# Patient Record
Sex: Male | Born: 1995 | Race: White | Hispanic: No | Marital: Single | Smoking: Former smoker
Health system: Southern US, Community
[De-identification: ages and names within clinical notes are randomized; demographics above are authoritative.]

## PROBLEM LIST (undated history)

## (undated) DIAGNOSIS — J45909 Unspecified asthma, uncomplicated: Secondary | ICD-10-CM

## (undated) DIAGNOSIS — F419 Anxiety disorder, unspecified: Secondary | ICD-10-CM

## (undated) DIAGNOSIS — R001 Bradycardia, unspecified: Secondary | ICD-10-CM

## (undated) DIAGNOSIS — F329 Major depressive disorder, single episode, unspecified: Secondary | ICD-10-CM

## (undated) DIAGNOSIS — Z8489 Family history of other specified conditions: Secondary | ICD-10-CM

## (undated) DIAGNOSIS — F32A Depression, unspecified: Secondary | ICD-10-CM

## (undated) DIAGNOSIS — K219 Gastro-esophageal reflux disease without esophagitis: Secondary | ICD-10-CM

## (undated) DIAGNOSIS — R55 Syncope and collapse: Secondary | ICD-10-CM

## (undated) DIAGNOSIS — K5900 Constipation, unspecified: Secondary | ICD-10-CM

## (undated) DIAGNOSIS — F319 Bipolar disorder, unspecified: Secondary | ICD-10-CM

## (undated) DIAGNOSIS — R0600 Dyspnea, unspecified: Secondary | ICD-10-CM

---
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
## 2016-10-13 ENCOUNTER — Emergency Department (HOSPITAL_COMMUNITY): Payer: Medicaid Other

## 2016-10-13 ENCOUNTER — Emergency Department (HOSPITAL_COMMUNITY)
Admission: EM | Admit: 2016-10-13 | Discharge: 2016-10-13 | Disposition: A | Discharge status: Home / Self Care | Payer: Medicaid Other | Attending: Emergency Medicine | Admitting: Emergency Medicine

## 2016-10-13 ENCOUNTER — Encounter (HOSPITAL_COMMUNITY): Payer: Self-pay | Admitting: *Deleted

## 2016-10-13 DIAGNOSIS — Y929 Unspecified place or not applicable: Secondary | ICD-10-CM | POA: Diagnosis not present

## 2016-10-13 DIAGNOSIS — S62625A Displaced fracture of medial phalanx of left ring finger, initial encounter for closed fracture: Secondary | ICD-10-CM

## 2016-10-13 DIAGNOSIS — S62624A Displaced fracture of medial phalanx of right ring finger, initial encounter for closed fracture: Secondary | ICD-10-CM | POA: Diagnosis not present

## 2016-10-13 DIAGNOSIS — S6991XA Unspecified injury of right wrist, hand and finger(s), initial encounter: Secondary | ICD-10-CM | POA: Diagnosis present

## 2016-10-13 DIAGNOSIS — Y998 Other external cause status: Secondary | ICD-10-CM | POA: Insufficient documentation

## 2016-10-13 DIAGNOSIS — Y9389 Activity, other specified: Secondary | ICD-10-CM | POA: Diagnosis not present

## 2016-10-13 DIAGNOSIS — F17210 Nicotine dependence, cigarettes, uncomplicated: Secondary | ICD-10-CM | POA: Diagnosis not present

## 2016-10-13 DIAGNOSIS — W1839XA Other fall on same level, initial encounter: Secondary | ICD-10-CM | POA: Insufficient documentation

## 2016-10-13 DIAGNOSIS — S63254A Unspecified dislocation of right ring finger, initial encounter: Secondary | ICD-10-CM | POA: Insufficient documentation

## 2016-10-13 DIAGNOSIS — S63259A Unspecified dislocation of unspecified finger, initial encounter: Secondary | ICD-10-CM

## 2016-10-13 MED ORDER — IBUPROFEN 600 MG PO TABS: 600.0000 mg | ORAL_TABLET | Freq: Four times a day (QID) | ORAL | 0 refills | Status: DC | PRN

## 2016-10-13 NOTE — ED Provider Notes (Signed)
[facility]-EMERGENCY DEPT Provider Note   CSN: [id number] Arrival date & time: 10/13/16  1559  By signing my name below, I, Cynda Acres, attest that this documentation has been prepared under the direction and in the presence of [employer].  Electronically Signed: Cynda Acres, Scribe. 10/13/16. 4:45 PM.  History   Chief Complaint Chief Complaint  Patient presents with  . Finger Injury   HPI Comments: Tommy Fernandez is a 21 y.o. male with a history of anxiety, depression, and bipolarism, who presents to the Emergency Department complaining of sudden-onset, constant right ring finger pain that began 3 weeks ago. Patient reports play fighting with his friend, when he fell, landing on his right ring finger. Patient denies having his finger evaluated prior to today. Patient reports associated swelling. No modifying factors indicated. Patient is right hand dominant. Patient denies any fever, nausea, vomiting, numbness, weakness, or tingling.   The history is provided by the patient. No language interpreter was used.    Past Medical History:  Diagnosis Date  . Anxiety   . Bipolar and related disorder (HCC)   . Depression     There are no active problems to display for this patient.   History reviewed. No pertinent surgical history.     Home Medications    Prior to Admission medications   Medication Sig Start Date End Date Taking? Authorizing Provider  HYDROcodone-acetaminophen (NORCO/VICODIN) 5-325 MG per tablet Take one tab po q 4-6 hrs prn pain Patient not taking: Reported on 03/03/2015 12/06/14   Tammy Triplett, PA-C  meloxicam (MOBIC) 7.5 MG tablet Take 1 tablet (7.5 mg total) by mouth daily. Patient not taking: Reported on 03/03/2015 12/11/14   Elson Areas, PA-C  methocarbamol (ROBAXIN) 500 MG tablet Take 1 tablet (500 mg total) by mouth 2 (two) times daily. Patient not taking: Reported on 03/03/2015 12/11/14   Elson Areas, PA-C  naproxen (NAPROSYN) 500 MG tablet Take 1  tablet (500 mg total) by mouth 2 (two) times daily with a meal. Patient not taking: Reported on 03/03/2015 12/06/14   Pauline Aus, PA-C    Family History No family history on file.  Social History Social History  Substance Use Topics  . Smoking status: Current Every Day Smoker    Packs/day: 0.50    Years: 2.00    Types: Cigarettes  . Smokeless tobacco: Never Used  . Alcohol use Yes     Allergies   Patient has no known allergies.   Review of Systems Review of Systems  Constitutional: Negative for fever.  Gastrointestinal: Negative for nausea and vomiting.  Musculoskeletal: Positive for arthralgias (right ring finger) and joint swelling (right ring finger).  Neurological: Negative for weakness and numbness.  All other systems reviewed and are negative.    Physical Exam Updated Vital Signs BP 115/66 (BP Location: Left Arm)   Pulse 67   Temp 98.2 F (36.8 C) (Oral)   Resp 18   Ht  (1.803 m)   Wt 130 lb (59 kg)   SpO2 100%   BMI 18.13 kg/m   Physical Exam  Constitutional: He is oriented to person, place, and time. He appears well-developed.  HENT:  Head: Normocephalic and atraumatic.  Mouth/Throat: Oropharynx is clear and moist.  Eyes: Conjunctivae and EOM are normal. Pupils are equal, round, and reactive to light.  Neck: Normal range of motion. Neck supple.  Cardiovascular: Normal rate and regular rhythm.   Pulmonary/Chest: Effort normal and breath sounds normal.  Abdominal: Soft. Bowel  sounds are normal.  Musculoskeletal: Normal range of motion. He exhibits tenderness.  Capillary refill of the right ring finger is less than two seconds. Good range of motion of the DIP joint. Swelling of the PIP joint. Good range of motion of the MTP joint. Full range of motion of the wrist. Radial pulse 2+. No deformity of the thenar eminence. Two scabs of the dorsum of the ring finger. No red streaking appreciated.    Neurological: He is alert and oriented to person,  place, and time.  Skin: Skin is warm and dry. Capillary refill takes less than 2 seconds. No rash noted. No erythema. No pallor.  Psychiatric: He has a normal mood and affect.  Nursing note and vitals reviewed.    ED Treatments / Results  DIAGNOSTIC STUDIES: Oxygen Saturation is 100% on RA, normal by my interpretation.    COORDINATION OF CARE: 4:44 PM Discussed treatment plan with pt at bedside and pt agreed to plan, which includes a splint and a hand specialist referral.   Labs (all labs ordered are listed, but only abnormal results are displayed) Labs Reviewed - No data to display  EKG  EKG Interpretation None       Radiology No results found.  Procedures Procedures (including critical care time) FRACTURE CARE RIGHT RING FINGER Patient sustained an injury to the right ring finger approximately 3-4 weeks ago. X-ray reveals a mild impacted avulsion volar fracture at the base of the middle phalanx on. I discussed the fracture with the patient in terms which he understands. I've demonstrated the fracture to the patient on a paper copy of the x-ray. I discussed the mobilization and splinting procedure. The patient is in agreement and gives permission.  Patient identified by arm band. The patient was fitted with a phone/aluminum splint on the palmar surface. Adequately secured. Following the procedure, the capillary refill is less than 2 seconds. There no temperature changes appreciated. Patient tolerated the procedure without problem. The patient will use 600 mg of ibuprofen and 500 mg of Tylenol every 6 hours for discomfort. He has been asked to elevate his hand above his heart is much as possible. Medications Ordered in ED Medications - No data to display   Initial Impression / Assessment and Plan / ED Course  I have reviewed the triage vital signs and the nursing notes.  Pertinent labs & imaging results that were available during my care of the patient were reviewed by me and  considered in my medical decision making (see chart for details).    Final Clinical Impressions(s) / ED Diagnoses   MDM: Patient injured his right ring finger about 3 weeks ago. He has had problems with pain and swelling since that time. X-ray reveals an avulsion fracture of 7 mm and a  dorsal displacement. The patient will be fitted with a finger splint and referred to hand surgery. I discussed the importance of seeing the hand specialist and possible long term problems of not having this problem resolved. Patient acknowledges discharge instructions.   Final diagnoses:  Closed displaced fracture of middle phalanx of left ring finger, initial encounter  Dislocation of finger, initial encounter    New Prescriptions New Prescriptions   No medications on file   **I personally performed the services described in this documentation, which was scribed in my presence. The recorded information has been reviewed and is accurate.Ivery Quale, PA-C 10/13/16 1706    Linwood Dibbles, MD 10/15/16 2104

## 2016-10-13 NOTE — ED Triage Notes (Signed)
Pt was playing with a friend when he "jammed" his finger 3-4 weeks ago. Right hand ring finger is swollen and tender to the touch

## 2016-10-13 NOTE — Discharge Instructions (Signed)
You have an avulsion fracture  and a dislocation of the ring finger on the right hand. It is important that you see a hand specialist as soon as possible. Use the splint until seen by the specialist. Use  of ibuprofen and  of tylenol every 6 hours for discomfort. Keep your hand elevated above your heart as much as possible.

## 2016-10-20 ENCOUNTER — Other Ambulatory Visit: Payer: Self-pay | Admitting: Orthopedic Surgery

## 2016-10-20 NOTE — H&P (Signed)
Tommy Fernandez is an 21 y.o. male.   Chief Complaint: CLOSED DISPLACED FRACTURE OF THE MIDDLE PHALANX OF THE RIGHT RING FINGER  HPI: Tommy Fernandez IS A 21 Y/O RIGHT HAND DOMINANT MALE WHO INJURED HIS RIGHT RING FINGER ABOUT 4 WEEKS AGO WHILE "HORSING AROUND" WITH A FRIEND.  HE WAS SEEN IN THE EMERGENCY DEPARTMENT ON 10/13/16 AND PLACED IN A FINGER SPLINT.  HE PRESENTED TO THE OFFICE ON 10/16/16 FOR FURTHER EVALUATION. DISCUSSED THE REASON AND RATIONALE FOR SURGERY. DISCUSSED THE SURGICAL PROCEDURE, INCLUDING THE RISKS VERSUS BENEFITS, AND THE POST-OPERATIVE RECOVERY PROCESS.  ADVISED PATIENT TO CONTINUE TO WEAR THE FINGER SPLINT UNTIL HE ARRIVES FOR SURGERY. THE PATIENT IS HERE TODAY FOR SURGERY.   Past Medical History:  Diagnosis Date  . Anxiety   . Bipolar and related disorder (HCC)   . Depression     No past surgical history on file.  No family history on file. Social History:  reports that he has been smoking Cigarettes.  He has a 1.00 pack-year smoking history. He has never used smokeless tobacco. He reports that he drinks alcohol. He reports that he uses drugs.  Allergies: No Known Allergies  No prescriptions prior to admission.    No results found for this or any previous visit (from the past 48 hour(s)). No results found.  ROS NO RECENT ILLNESSES OR HOSPITALIZATIONS  There were no vitals taken for this visit. Physical Exam  General Appearance:  Alert, cooperative, no distress, appears stated age  Head:  Normocephalic, without obvious abnormality, atraumatic  Eyes:  Pupils equal, conjunctiva/corneas clear,         Throat: Lips, mucosa, and tongue normal; teeth and gums normal  Neck: No visible masses     Lungs:   respirations unlabored  Chest Wall:  No tenderness or deformity  Heart:  Regular rate and rhythm,  Abdomen:   Soft, non-tender,         Extremities: RUE: SOFT TISSUE SWELLING OF THE RIGHT RING FINGER. NO ECCHYMOSIS, ERYTHEMA, OR OPEN WOUNDS OF THE FINGER. PULSES  2+ BILATERALLY. SENSATION INTACT TO LIGHT TOUCH. LIMITED FLEXION AND EXTENSION OF THE RING FINGER. FULL RANGE OF MOTION OF ALL OTHER DIGITS.  Pulses: 2+ and symmetric  Skin: Skin color, texture, turgor normal, no rashes or lesions     Neurologic: Normal    Assessment CLOSED DISPLACED FRACTURE OF THE MIDDLE PHALANX OF THE RIGHT RING FINGER/ Ring finger fracture dislocation of the proximal interphalangeal joint  Plan RIGHT RING FINGER CLOSED REDUCTION AND PINNING, POSSIBLE EXTERNAL FIXATION AND OPEN REDUCTION  Patient was seen and examined today in the hospital. The patient is very anxious. The patient has a very difficult problem with regard to his right ring finger. We will make every effort to write try to restore the articular congruity of the ring finger PIP joint. This may require external fixation versus open reduction versus pinning of the joint. Risks include but not limited to bleeding infection damage to nearby nerves arteries or tendons stiffness loss of motion of wrists and digits and need for further surgical intervention. A signed informed consent was obtained. All questions were answered today. Patient voiced understanding of plan the reason for the intervention.   R/B/A DISCUSSED WITH PT IN OFFICE.  PT VOICED UNDERSTANDING OF PLAN CONSENT SIGNED DAY OF SURGERY PT SEEN AND EXAMINED PRIOR TO OPERATIVE PROCEDURE/DAY OF SURGERY SITE MARKED. QUESTIONS ANSWERED WILL GO HOME FOLLOWING SURGERY  WE ARE PLANNING SURGERY FOR YOUR UPPER EXTREMITY. THE RISKS AND BENEFITS  OF SURGERY INCLUDE BUT NOT LIMITED TO BLEEDING INFECTION, DAMAGE TO NEARBY NERVES ARTERIES TENDONS, FAILURE OF SURGERY TO ACCOMPLISH ITS INTENDED GOALS, PERSISTENT SYMPTOMS AND NEED FOR FURTHER SURGICAL INTERVENTION. WITH THIS IN MIND WE WILL PROCEED. I HAVE DISCUSSED WITH THE PATIENT THE PRE AND POSTOPERATIVE REGIMEN AND THE DOS AND DON'TS. PT VOICED UNDERSTANDING AND INFORMED CONSENT SIGNED.  Karma Greaser 10/20/2016, 12:06 PM

## 2016-10-21 ENCOUNTER — Encounter (HOSPITAL_COMMUNITY): Payer: Self-pay | Admitting: *Deleted

## 2016-10-21 NOTE — Progress Notes (Signed)
Anesthesia Chart Review:  Pt is a same day work up.   Pt is a 21 year old male scheduled for r ring finger closed reduction and pinning, possible open reduction external fixation on 10/22/2016 with Bradly Bienenstock, MD  PMH includes:  Syncope and bradycardia, asthma (as a child), anxiety, depression, bipolar disorder, GERD. Former smoker. BMI 18  - Pt was evaluated by pediatric cardiology in 2015 for syncope and bradycardia (notes in care everywhere).  Notes indicate symptoms most likely due to excessive caffeine consumption and symptoms resolved with cessation of coffee and sodas and increasing fluid intake. No further workup recommended.   Medications reviewed.  Labs will be obtained DOS.   EKG will be obtained DOS.   Pt reported recent chest pain to PAT RN during pre-op telephone call. Pt believes it is anxiety.  EKG will be obtained DOS.  Pt will need further assessment by assigned anesthesiologist DOS.   Rica Mast, FNP-BC [facility]/Anesthesiology Phone: [phone] 10/21/2016 2:35 PM

## 2016-10-21 NOTE — Progress Notes (Addendum)
Mr Neidert  Was seen in 2015 by a pediatric cardiologist to evaluate and patient had episodes of syncope and collapse.  EKG showed Sinus Brady, Heart rate 53.  Patient was instructed to stay hydrated , decrease caffeine and to follow up with cardiologist if needed.

## 2016-10-21 NOTE — Progress Notes (Signed)
I spoke with Tommy Fernandez who reports that he has not had any syncopal episodes since 2017.  Patient does report that he has had chest pain, last time was 2 weeks ago.  Has had  Chest pain on and off for a year or so.Patient describes the pain as crushing in lower sternum 7- 8 of a 10. he gets short of breath when this happens, denies light headedness or nausea. Patient reports that is is usually walking or something and then he sits down and it goes away in 1 - 2 minutes. Patient has not seen a  Dr about this, he does not have a PCP.  "I figure it it anxiety."

## 2016-10-22 ENCOUNTER — Encounter (HOSPITAL_COMMUNITY)
Admission: RE | Disposition: A | Discharge status: Home / Self Care | Payer: Self-pay | Source: Ambulatory Visit | Attending: Orthopedic Surgery

## 2016-10-22 ENCOUNTER — Ambulatory Visit (HOSPITAL_COMMUNITY): Payer: Medicaid Other | Admitting: Emergency Medicine

## 2016-10-22 ENCOUNTER — Encounter (HOSPITAL_COMMUNITY): Payer: Self-pay

## 2016-10-22 ENCOUNTER — Ambulatory Visit (HOSPITAL_COMMUNITY)
Admission: RE | Admit: 2016-10-22 | Discharge: 2016-10-22 | Disposition: A | Discharge status: Home / Self Care | Payer: Medicaid Other | Source: Ambulatory Visit | Attending: Orthopedic Surgery | Admitting: Orthopedic Surgery

## 2016-10-22 DIAGNOSIS — S62624A Displaced fracture of medial phalanx of right ring finger, initial encounter for closed fracture: Secondary | ICD-10-CM

## 2016-10-22 DIAGNOSIS — F319 Bipolar disorder, unspecified: Secondary | ICD-10-CM | POA: Insufficient documentation

## 2016-10-22 DIAGNOSIS — Y9383 Activity, rough housing and horseplay: Secondary | ICD-10-CM | POA: Insufficient documentation

## 2016-10-22 DIAGNOSIS — F17210 Nicotine dependence, cigarettes, uncomplicated: Secondary | ICD-10-CM | POA: Insufficient documentation

## 2016-10-22 DIAGNOSIS — S63284A Dislocation of proximal interphalangeal joint of right ring finger, initial encounter: Secondary | ICD-10-CM | POA: Insufficient documentation

## 2016-10-22 HISTORY — DX: Dyspnea, unspecified: R06.00

## 2016-10-22 HISTORY — DX: Constipation, unspecified: K59.00

## 2016-10-22 HISTORY — PX: CLOSED REDUCTION FINGER WITH PERCUTANEOUS PINNING: SHX5612

## 2016-10-22 HISTORY — DX: Unspecified asthma, uncomplicated: J45.909

## 2016-10-22 HISTORY — DX: Family history of other specified conditions: Z84.89

## 2016-10-22 HISTORY — DX: Gastro-esophageal reflux disease without esophagitis: K21.9

## 2016-10-22 HISTORY — DX: Bradycardia, unspecified: R00.1

## 2016-10-22 HISTORY — DX: Syncope and collapse: R55

## 2016-10-22 LAB — COMPREHENSIVE METABOLIC PANEL
ALBUMIN: 4.9 g/dL (ref 3.5–5.0)
ALK PHOS: 53 U/L (ref 38–126)
ALT: 12 U/L — AB (ref 17–63)
AST: 21 U/L (ref 15–41)
Anion gap: 10 (ref 5–15)
BILIRUBIN TOTAL: 0.7 mg/dL (ref 0.3–1.2)
BUN: 18 mg/dL (ref 6–20)
CALCIUM: 9.6 mg/dL (ref 8.9–10.3)
CO2: 26 mmol/L (ref 22–32)
CREATININE: 1.17 mg/dL (ref 0.61–1.24)
Chloride: 103 mmol/L (ref 101–111)
GFR calc Af Amer: >60 mL/min (ref >60)
Glucose, Bld: 87 mg/dL (ref 65–99)
POTASSIUM: 3.7 mmol/L (ref 3.5–5.1)
Sodium: 139 mmol/L (ref 135–145)
Total Protein: 7.7 g/dL (ref 6.5–8.1)

## 2016-10-22 LAB — CBC
HEMATOCRIT: 46.7 % (ref 39.0–52.0)
HEMOGLOBIN: 16.5 g/dL (ref 13.0–17.0)
MCH: 30.2 pg (ref 26.0–34.0)
MCHC: 35.3 g/dL (ref 30.0–36.0)
MCV: 85.4 fL (ref 78.0–100.0)
Platelets: 187 10*3/uL (ref 150–400)
RBC: 5.47 MIL/uL (ref 4.22–5.81)
RDW: 12.1 % (ref 11.5–15.5)
WBC: 10.7 10*3/uL — ABNORMAL HIGH (ref 4.0–10.5)

## 2016-10-22 SURGERY — CLOSED REDUCTION, FINGER, WITH PERCUTANEOUS PINNING
Anesthesia: General | Laterality: Right

## 2016-10-22 MED ORDER — KETOROLAC TROMETHAMINE 30 MG/ML IJ SOLN
INTRAMUSCULAR | Status: AC
  Administered 2016-10-22: 30 mg via INTRAVENOUS
  Filled 2016-10-22: qty 1

## 2016-10-22 MED ORDER — OXYCODONE HCL 5 MG PO TABS
5.0000 mg | ORAL_TABLET | Freq: Once | ORAL | Status: AC | PRN
  Administered 2016-10-22: 5 mg via ORAL

## 2016-10-22 MED ORDER — HYDROMORPHONE HCL 1 MG/ML IJ SOLN
INTRAMUSCULAR | Status: AC
  Administered 2016-10-22: 0.5 mg via INTRAVENOUS
  Filled 2016-10-22: qty 0.5

## 2016-10-22 MED ORDER — PROPOFOL 10 MG/ML IV BOLUS
INTRAVENOUS | Status: DC | PRN
Start: 2016-10-22 — End: 2016-10-22
  Administered 2016-10-22: 200 mg via INTRAVENOUS

## 2016-10-22 MED ORDER — MIDAZOLAM HCL 2 MG/2ML IJ SOLN
INTRAMUSCULAR | Status: AC
  Filled 2016-10-22: qty 2

## 2016-10-22 MED ORDER — OXYCODONE-ACETAMINOPHEN 5-325 MG PO TABS: 1.0000 | ORAL_TABLET | Freq: Three times a day (TID) | ORAL | 0 refills | Status: AC

## 2016-10-22 MED ORDER — ONDANSETRON HCL 4 MG/2ML IJ SOLN
INTRAMUSCULAR | Status: DC | PRN
  Administered 2016-10-22: 4 mg via INTRAVENOUS

## 2016-10-22 MED ORDER — DOCUSATE SODIUM 100 MG PO CAPS: 100.0000 mg | ORAL_CAPSULE | Freq: Two times a day (BID) | ORAL | 0 refills | Status: DC

## 2016-10-22 MED ORDER — DIPHENHYDRAMINE HCL 50 MG/ML IJ SOLN
INTRAMUSCULAR | Status: AC
  Filled 2016-10-22: qty 1

## 2016-10-22 MED ORDER — FENTANYL CITRATE (PF) 100 MCG/2ML IJ SOLN
INTRAMUSCULAR | Status: DC | PRN
  Administered 2016-10-22: 50 ug via INTRAVENOUS
  Administered 2016-10-22 (×2): 25 ug via INTRAVENOUS
  Administered 2016-10-22: 50 ug via INTRAVENOUS

## 2016-10-22 MED ORDER — CHLORHEXIDINE GLUCONATE 4 % EX LIQD: 60.0000 mL | Freq: Once | CUTANEOUS | Status: DC

## 2016-10-22 MED ORDER — HYDROMORPHONE HCL 1 MG/ML IJ SOLN
0.2500 mg | INTRAMUSCULAR | Status: DC | PRN
  Administered 2016-10-22 (×3): 0.5 mg via INTRAVENOUS

## 2016-10-22 MED ORDER — FENTANYL CITRATE (PF) 250 MCG/5ML IJ SOLN
INTRAMUSCULAR | Status: AC
  Filled 2016-10-22: qty 10

## 2016-10-22 MED ORDER — OXYCODONE HCL 5 MG PO TABS
ORAL_TABLET | ORAL | Status: AC
  Administered 2016-10-22: 5 mg via ORAL
  Filled 2016-10-22: qty 1

## 2016-10-22 MED ORDER — HYDROMORPHONE HCL 1 MG/ML IJ SOLN
INTRAMUSCULAR | Status: AC
  Administered 2016-10-22: 0.5 mg via INTRAVENOUS
  Filled 2016-10-22: qty 1

## 2016-10-22 MED ORDER — CEFAZOLIN SODIUM-DEXTROSE 2-4 GM/100ML-% IV SOLN: 2.0000 g | INTRAVENOUS | Status: DC

## 2016-10-22 MED ORDER — OXYCODONE-ACETAMINOPHEN 5-325 MG PO TABS
ORAL_TABLET | ORAL | Status: AC
  Administered 2016-10-22: 1
  Filled 2016-10-22: qty 1

## 2016-10-22 MED ORDER — SODIUM CHLORIDE 0.9 % IR SOLN
Status: DC | PRN
  Administered 2016-10-22: 1000 mL

## 2016-10-22 MED ORDER — DIPHENHYDRAMINE HCL 50 MG/ML IJ SOLN
INTRAMUSCULAR | Status: DC | PRN
  Administered 2016-10-22: 12.5 mg via INTRAVENOUS

## 2016-10-22 MED ORDER — CEFAZOLIN SODIUM 1 G IJ SOLR
INTRAMUSCULAR | Status: DC | PRN
  Administered 2016-10-22: 2 g via INTRAMUSCULAR

## 2016-10-22 MED ORDER — MIDAZOLAM HCL 5 MG/5ML IJ SOLN
INTRAMUSCULAR | Status: DC | PRN
  Administered 2016-10-22 (×2): 2 mg via INTRAVENOUS

## 2016-10-22 MED ORDER — OXYCODONE HCL 5 MG/5ML PO SOLN: 5.0000 mg | Freq: Once | ORAL | Status: AC | PRN

## 2016-10-22 MED ORDER — GLYCOPYRROLATE 0.2 MG/ML IJ SOLN
INTRAMUSCULAR | Status: DC | PRN
  Administered 2016-10-22: 0.2 mg via INTRAVENOUS

## 2016-10-22 MED ORDER — KETOROLAC TROMETHAMINE 30 MG/ML IJ SOLN
30.0000 mg | Freq: Once | INTRAMUSCULAR | Status: AC
  Administered 2016-10-22: 30 mg via INTRAVENOUS

## 2016-10-22 MED ORDER — ONDANSETRON HCL 4 MG/2ML IJ SOLN
INTRAMUSCULAR | Status: AC
  Filled 2016-10-22: qty 2

## 2016-10-22 MED ORDER — CEFAZOLIN SODIUM-DEXTROSE 2-4 GM/100ML-% IV SOLN
INTRAVENOUS | Status: AC
  Filled 2016-10-22: qty 100

## 2016-10-22 MED ORDER — LACTATED RINGERS IV SOLN
INTRAVENOUS | Status: DC | PRN
  Administered 2016-10-22 (×2): via INTRAVENOUS

## 2016-10-22 MED ORDER — PROPOFOL 10 MG/ML IV BOLUS
INTRAVENOUS | Status: AC
  Filled 2016-10-22: qty 20

## 2016-10-22 MED ORDER — LIDOCAINE HCL (CARDIAC) 20 MG/ML IV SOLN
INTRAVENOUS | Status: DC | PRN
  Administered 2016-10-22: 60 mg via INTRAVENOUS

## 2016-10-22 SURGICAL SUPPLY — 37 items
BANDAGE ACE 3X5.8 VEL STRL LF (GAUZE/BANDAGES/DRESSINGS) IMPLANT
BANDAGE ACE 4X5 VEL STRL LF (GAUZE/BANDAGES/DRESSINGS) ×3 IMPLANT
BENZOIN TINCTURE PRP APPL 2/3 (GAUZE/BANDAGES/DRESSINGS) IMPLANT
BLADE CLIPPER SURG (BLADE) IMPLANT
BNDG ELASTIC 2X5.8 VLCR STR LF (GAUZE/BANDAGES/DRESSINGS) ×3 IMPLANT
BNDG GAUZE ELAST 4 BULKY (GAUZE/BANDAGES/DRESSINGS) ×3 IMPLANT
CLOSURE WOUND 1/2 X4 (GAUZE/BANDAGES/DRESSINGS)
COVER SURGICAL LIGHT HANDLE (MISCELLANEOUS) ×3 IMPLANT
CUFF TOURNIQUET SINGLE 18IN (TOURNIQUET CUFF) ×3 IMPLANT
CUFF TOURNIQUET SINGLE 24IN (TOURNIQUET CUFF) IMPLANT
DRAPE OEC MINIVIEW 54X84 (DRAPES) ×3 IMPLANT
DRSG EMULSION OIL 3X3 NADH (GAUZE/BANDAGES/DRESSINGS) IMPLANT
GAUZE SPONGE 4X4 12PLY STRL (GAUZE/BANDAGES/DRESSINGS) ×3 IMPLANT
GAUZE XEROFORM 1X8 LF (GAUZE/BANDAGES/DRESSINGS) ×3 IMPLANT
GLOVE BIOGEL PI IND STRL 8.5 (GLOVE) ×1 IMPLANT
GLOVE BIOGEL PI INDICATOR 8.5 (GLOVE) ×2
GLOVE SURG ORTHO 8.0 STRL STRW (GLOVE) ×3 IMPLANT
GOWN STRL REUS W/ TWL LRG LVL3 (GOWN DISPOSABLE) ×2 IMPLANT
GOWN STRL REUS W/TWL LRG LVL3 (GOWN DISPOSABLE) ×4
GUIDEWIRE ORTH 6X062XTROC NS (WIRE) ×1 IMPLANT
K-WIRE .062 (WIRE) ×2
KIT BASIN OR (CUSTOM PROCEDURE TRAY) ×3 IMPLANT
KIT ROOM TURNOVER OR (KITS) ×3 IMPLANT
NS IRRIG 1000ML POUR BTL (IV SOLUTION) ×3 IMPLANT
PACK ORTHO EXTREMITY (CUSTOM PROCEDURE TRAY) ×3 IMPLANT
PAD ARMBOARD 7.5X6 YLW CONV (MISCELLANEOUS) ×3 IMPLANT
SOAP 2 % CHG 4 OZ (WOUND CARE) IMPLANT
STRIP CLOSURE SKIN 1/2X4 (GAUZE/BANDAGES/DRESSINGS) IMPLANT
SUT ETHIBOND 4 0 TF (SUTURE) ×3 IMPLANT
SUT ETHILON 4 0 P 3 18 (SUTURE) IMPLANT
SUT ETHILON 5 0 P 3 18 (SUTURE)
SUT NYLON ETHILON 5-0 P-3 1X18 (SUTURE) IMPLANT
SUT PROLENE 4 0 P 3 18 (SUTURE) IMPLANT
SUT VICRYL RAPIDE 4/0 PS 2 (SUTURE) ×3 IMPLANT
TOWEL OR 17X24 6PK STRL BLUE (TOWEL DISPOSABLE) IMPLANT
TOWEL OR 17X26 10 PK STRL BLUE (TOWEL DISPOSABLE) IMPLANT
WATER STERILE IRR 1000ML POUR (IV SOLUTION) IMPLANT

## 2016-10-22 NOTE — Anesthesia Preprocedure Evaluation (Signed)
Anesthesia Evaluation  Patient identified by MRN, date of birth, ID band  Reviewed: Allergy & Precautions, NPO status , Patient's Chart, lab work & pertinent test results  History of Anesthesia Complications Negative for: history of anesthetic complications  Airway Mallampati: I  TM Distance: >3 FB Neck ROM: Full    Dental  (+) Teeth Intact   Pulmonary asthma , former smoker,    breath sounds clear to auscultation       Cardiovascular negative cardio ROS   Rhythm:Regular     Neuro/Psych PSYCHIATRIC DISORDERS Anxiety Depression Bipolar Disorder    GI/Hepatic negative GI ROS, Neg liver ROS,   Endo/Other  negative endocrine ROS  Renal/GU negative Renal ROS     Musculoskeletal   Abdominal   Peds  Hematology negative hematology ROS (+)   Anesthesia Other Findings   Reproductive/Obstetrics                             Anesthesia Physical Anesthesia Plan  ASA: II  Anesthesia Plan: General   Post-op Pain Management:    Induction: Intravenous  Airway Management Planned: LMA  Additional Equipment: None  Intra-op Plan:   Post-operative Plan: Extubation in OR  Informed Consent: I have reviewed the patients History and Physical, chart, labs and discussed the procedure including the risks, benefits and alternatives for the proposed anesthesia with the patient or authorized representative who has indicated his/her understanding and acceptance.   Dental advisory given  Plan Discussed with: CRNA and Surgeon  Anesthesia Plan Comments:         Anesthesia Quick Evaluation

## 2016-10-22 NOTE — Transfer of Care (Signed)
Immediate Anesthesia Transfer of Care Note  Patient: Tommy Fernandez  Procedure(s) Performed: Procedure(s): RIGHT RING FINGER CLOSED REDUCTION AND PINNING, POSSIBLE OPEN REDUCTION EXTERNAL FIXATION (ORIF) FINGER (Right)  Patient Location: PACU  Anesthesia Type:General  Level of Consciousness: awake and alert   Airway & Oxygen Therapy: Patient Spontanous Breathing and Patient connected to nasal cannula oxygen  Post-op Assessment: Report given to RN, Post -op Vital signs reviewed and stable and Patient moving all extremities  Post vital signs: Reviewed and stable  Last Vitals:  Vitals:   10/22/16 1419  BP: 131/86  Pulse: 72  Resp: 18  Temp: 37.1 C    Last Pain:  Vitals:   10/22/16 1419  TempSrc: Oral      Patients Stated Pain Goal: 3 (10/22/16 1414)  Complications: No apparent anesthesia complications

## 2016-10-22 NOTE — Op Note (Signed)
PREOPERATIVE DIAGNOSIS: Right ring finger middle phalanx fracture Right ring finger proximal interphalangeal joint dislocation  POSTOPERATIVE DIAGNOSIS: Same  ATTENDING SURGEON: Dr Bradly Bienenstock he was scrubbed and present for the entire procedure  ASSISTANT SURGEON: Lambert Mody PA-C who was scrubbed and necessary for the entire procedure for the open reduction internal fixation closure and application of the splint  ANESTHESIA: General via laryngeal mask airway  OPERATIVE PROCEDURE: #1. Open treatment of right ring finger middle phalanx fracture involving the articular surface of the interphalangeal joint requiring internal fixation  #2 open treatment of right ring finger proximal interphalangeal dislocation  #3. Application of uniplane external fixation  IMPLANTS: 0.045 K wire  RADIOGRAPHIC INTERPRETATION: AP lateral and oblique views of the finger do show the reduction of the PIP joint with a cross K wire in good position.  SURGICAL INDICATIONS: Is a 21 year old right-hand-dominant gentleman who presented to the office with the 4 week history of an injury to the right ring finger. Patient was seen and evaluated and recommended to undergo the above procedure. Risks of surgery include but not limited to bleeding infection damage to nearby nerves arteries or tendons loss of motion of wrists and digits incomplete relief of symptoms and need for further surgical intervention.  SURGICAL TECHNIQUE: Patient is properly identified in the preoperative holding area marked for a permanent marker was made on the right ring finger to indicate the correct operative site. Patient brought back to operating room placed supine on anesthesia and table general anesthesia was administered. A well-padded tourniquet was then placed on the right brachium and sealed with a 1000 drape. The right upper extremity and prepped and draped in normal sterile fashion. A timeout was called cracks that was  identified and the procedure was then begun. Attention was then turned to the right ring finger a longitudinal incision made directly over the dorsal aspect of the ring finger directly over the PIP joint. Dissection carried down through the skin and subcutaneous tissue. The extensor interval was split longitudinally exposing the joint. The patient did have a comminuted middle phalanx fracture. Open using a Therapist, nutritional the joint was then reduced and reduction of the joint was then carried out. Did not feel that the volar fragments were amenable to internal fixation. I also do not feel the patient was a candidate for hemi-hamate arthroplasty. Once this is carried out joint was then reduced open treatment of the intra-articular middle phalanx fracture was then done as well as the open treatment of the interphalangeal joint dislocation reduced and a 0.045 K wire was placed. Application the uniplane neck sterile fixation was then carried out. The wound was then thoroughly irrigated. The extensor interval was then closed with 4-0 Ethibond suture. Skin was then closed with 4-0 Vicryl simple sutures. Xeroform dressings circumflex compressive bandage applied. The patient was placed in this ulnar gutter splint extubated taken recovery room in good condition.  POSTOPERATIVE PLAN: To be discharged to home. Seen back now office in approximately 10 days for wound check. X-rays application another ulnar gutter splint. K wire in for a total of 4 weeks. Haywire at the 4 week mark and then begin some gentle motion. Dorsal blocking splint when the K wire comes are keeping him in slight flexion.  Prognosis: poor, the patient does have a highly comminuted finger fracture dislocation. Given the high degree of comminution and the volar loss of the articular surface I think the patient is going to be more of a candidate for arthrodesis of the  proximal interphalangeal joint should he have persistent pain or instability.

## 2016-10-22 NOTE — Discharge Instructions (Signed)
KEEP BANDAGE CLEAN AND DRY CALL OFFICE FOR F/U APPT [phone] in 10 days KEEP HAND ELEVATED ABOVE HEART OK TO APPLY ICE TO OPERATIVE AREA CONTACT OFFICE IF ANY WORSENING PAIN OR CONCERNS. General Anesthesia, Adult, Care After These instructions provide you with information about caring for yourself after your procedure. Your health care provider may also give you more specific instructions. Your treatment has been planned according to current medical practices, but problems sometimes occur. Call your health care provider if you have any problems or questions after your procedure. What can I expect after the procedure? After the procedure, it is common to have:  Vomiting.  A sore throat.  Mental slowness. It is common to feel:  Nauseous.  Cold or shivery.  Sleepy.  Tired.  Sore or achy, even in parts of your body where you did not have surgery. Follow these instructions at home: For at least 24 hours after the procedure:   Do not:  Participate in activities where you could fall or become injured.  Drive.  Use heavy machinery.  Drink alcohol.  Take sleeping pills or medicines that cause drowsiness.  Make important decisions or sign legal documents.  Take care of children on your own.  Rest. Eating and drinking   If you vomit, drink water, juice, or soup when you can drink without vomiting.  Drink enough fluid to keep your urine clear or pale yellow.  Make sure you have little or no nausea before eating solid foods.  Follow the diet recommended by your health care provider. General instructions   Have a responsible adult stay with you until you are awake and alert.  Return to your normal activities as told by your health care provider. Ask your health care provider what activities are safe for you.  Take over-the-counter and prescription medicines only as told by your health care provider.  If you smoke, do not smoke without supervision.  Keep all  follow-up visits as told by your health care provider. This is important. Contact a health care provider if:  You continue to have nausea or vomiting at home, and medicines are not helpful.  You cannot drink fluids or start eating again.  You cannot urinate after 8-12 hours.  You develop a skin rash.  You have fever.  You have increasing redness at the site of your procedure. Get help right away if:  You have difficulty breathing.  You have chest pain.  You have unexpected bleeding.  You feel that you are having a life-threatening or urgent problem. This information is not intended to replace advice given to you by your health care provider. Make sure you discuss any questions you have with your health care provider. Document Released: 09/29/2000 Document Revised: 11/26/2015 Document Reviewed: 06/07/2015 Elsevier Interactive Patient Education  2017 [employer].

## 2016-10-22 NOTE — OR Nursing (Signed)
Pt c/o 10/10 pain after dilaudid, oxycodone IR  and one percocet.Marland Kitchen  HR 57, RR 8-12 with prompts for deep breathing/coughing, BP 135/94, sats 95% on RA.  Pupils pinpoint. Dr. Okey Dupre notified of continued pain despite narcotics.  Toradol  IV ordered and given.

## 2016-10-23 ENCOUNTER — Encounter (HOSPITAL_COMMUNITY): Payer: Self-pay | Admitting: Orthopedic Surgery

## 2016-10-24 NOTE — Anesthesia Postprocedure Evaluation (Signed)
Anesthesia Post Note  Patient: Tommy Fernandez  Procedure(s) Performed: Procedure(s) (LRB): RIGHT RING FINGER CLOSED REDUCTION AND PINNING, POSSIBLE OPEN REDUCTION EXTERNAL FIXATION (ORIF) FINGER (Right)  Patient location during evaluation: PACU Anesthesia Type: General Level of consciousness: sedated and patient cooperative Pain management: pain level controlled Vital Signs Assessment: post-procedure vital signs reviewed and stable Respiratory status: spontaneous breathing Cardiovascular status: stable Anesthetic complications: no       Last Vitals:  Vitals:   10/22/16 1830 10/22/16 1845  BP: (!) 141/112 (!) 136/106  Pulse: 63 (!) 55  Resp: 16 17  Temp:  36.7 C    Last Pain:  Vitals:   10/22/16 1830  TempSrc:   PainSc: 9                  Lewie Loron

## 2016-12-10 ENCOUNTER — Ambulatory Visit: Payer: Medicaid Other | Attending: Orthopedic Surgery | Admitting: *Deleted

## 2016-12-10 ENCOUNTER — Encounter: Payer: Self-pay | Admitting: *Deleted

## 2016-12-10 DIAGNOSIS — R278 Other lack of coordination: Secondary | ICD-10-CM | POA: Diagnosis present

## 2016-12-10 DIAGNOSIS — M2560 Stiffness of unspecified joint, not elsewhere classified: Secondary | ICD-10-CM | POA: Diagnosis present

## 2016-12-10 DIAGNOSIS — M6281 Muscle weakness (generalized): Secondary | ICD-10-CM | POA: Diagnosis present

## 2016-12-10 DIAGNOSIS — R600 Localized edema: Secondary | ICD-10-CM | POA: Diagnosis present

## 2016-12-10 DIAGNOSIS — M79644 Pain in right finger(s): Secondary | ICD-10-CM

## 2016-12-10 NOTE — Patient Instructions (Signed)
We will be contacting the doctors office to see if they'd like you to begin any active movement or exercises to that right ring finger. Wear the splint until we follow up with you.   WEARING SCHEDULE:  Wear splint at ALL times except for hygiene care.  PURPOSE:  To prevent movement and for protection until injury can heal  CARE OF SPLINT:  Keep splint away from heat sources including: stove, radiator or furnace, or a car in sunlight. The splint can melt and will no longer fit you properly  Keep away from pets and children  Clean the splint with rubbing alcohol or luke warm water and soap. Do not use hot water. * During this time, make sure you also clean your hand/arm as instructed by your therapist and/or perform dressing changes as needed. Then dry hand/arm completely before replacing splint. (When cleaning hand/arm, keep it immobilized in same position until splint is replaced)  PRECAUTIONS/POTENTIAL PROBLEMS: *If you notice or experience increased pain, swelling, numbness, or a lingering reddened area from the splint: Try loosening the straps. If this does not solve the pain, swelling, numbness, contact your therapist immediately by calling [phone]. You must wear the splint for protection, but we will get you scheduled for adjustments as quickly as possible.  (If only straps or hooks need to be replaced and NO adjustments to the splint need to be made, just call the office ahead and let them know you are coming in)  If you have any medical concerns or signs of infection, please call your doctor immediately

## 2016-12-10 NOTE — Therapy (Signed)
[facility] [address] [place], [place], [address] Phone: [phone]   Fax:  [phone]  Occupational Therapy Evaluation  Patient Details  Name: Tommy Fernandez MRN: [id number] Date of Birth: 08/03/1995 Referring Provider: Dr Bradly BienenstockFred Ortmann  Encounter Date: 12/10/2016      OT End of Session - 12/10/16 1125    Visit Number 1   Number of Visits 12  MCD waiting on Auth. Request up to 12 visits   Authorization Type MCD - awaiting authorization. Pt is uner 22 and due to surgery and dx, requesting up to 12 visits over next 12 weeks.   Authorization - Visit Number 1  Eval 12/10/16   Authorization - Number of Visits --  See above   OT Start Time 1010   OT Stop Time 1105   OT Time Calculation (min) 55 min   Activity Tolerance Patient tolerated treatment well;No increased pain   Behavior During Therapy WFL for tasks assessed/performed      Past Medical History:  Diagnosis Date  . Anxiety   . Asthma    as a child   . Bipolar and related disorder (HCC)   . Bradycardia 2015  . Constipation   . Depression   . Dyspnea    sometimes just standing up no exertion  . Family history of adverse reaction to anesthesia    takes more medication to get her to sleep  . GERD (gastroesophageal reflux disease)   . Syncope 2015   10/21/16- last time approx 1 year ago    Past Surgical History:  Procedure Laterality Date  . CLOSED REDUCTION FINGER WITH PERCUTANEOUS PINNING Right 10/22/2016   Procedure: RIGHT RING FINGER CLOSED REDUCTION AND PINNING, POSSIBLE OPEN REDUCTION EXTERNAL FIXATION (ORIF) FINGER;  Surgeon: Bradly BienenstockFred Ortmann, MD;  Location: [facility] OR;  Service: Orthopedics;  Laterality: Right;    There were no vitals filed for this visit.      Subjective Assessment - 12/10/16 1018    Subjective  Pt reports that he fractured his right ring finger sometime in March 2018. He underwent ORIF of his right RF middle phalanx and pinning. He  was pinned for approximately 6 weeks or more per pt report. Pt is currently 7 weeks post-op.   Currently in Pain? Yes   Pain Score 2    Pain Location Finger (Comment which one)  Right ring finger   Pain Orientation Right   Pain Descriptors / Indicators Aching;Sore   Pain Type Acute pain;Surgical pain   Pain Onset More than a month ago   Pain Frequency Intermittent   Aggravating Factors  Movement and activity increased pain   Pain Relieving Factors Rest; elevation   Multiple Pain Sites No           [facility] OT Assessment - 12/10/16 0001      Assessment   Diagnosis R RF middle phalanx fracture w. PIP joint dislocation requiring internal fixation  Pin removed on 12/09/16   Referring Provider Dr Bradly BienenstockFred Ortmann   Onset Date 10/22/16   Assessment Pt reports that at last visit to MD office (12/09/16) yesterday, to get pin removed, that he was told that volar plate may not yet be healed, but was "showing some healing had started". Orders today are for splinting only. OT will email MD Office to get clarification of orders to see if they would like OT Eval and treatment in addition to treatment as pt stated that he was under assumption that he would be  getting "therapy to get some exercises" or movement/ROM.     Precautions   Precautions None  None per pt report     Balance Screen   Has the patient fallen in the past 6 months No   Has the patient had a decrease in activity level because of a fear of falling?  No   Is the patient reluctant to leave their home because of a fear of falling?  No     Home  Environment   Family/patient expects to be discharged to: Private residence   Lives With Other (Comment)  Friend/Roomate & Girlfriend      Prior Function   Level of Independence Independent with basic ADLs  Ocassional assist from roomate/girlfriend for functonal act.   Vocation Unemployed  Hoping to work at [employer] when cleared   Leisure Camping     ADL   ADL comments Overall Mod I to Min A  from roomate/girlfriend. Was not able to get hand wet when pin was in, therfore assist with some ADL's PRN.     Written Expression   Dominant Hand Right   Handwriting Increased time     Activity Tolerance   Activity Tolerance Comments WFL's     Observation/Other Assessments   Observations Pt has small scar R RF PIP joint. No drainage since pin was removed on 12/09/16 per pt report.     Sensation   Light Touch Appears Intact     Coordination   Gross Motor Movements are Fluid and Coordinated No  No grip/fist R dominant hand   Fine Motor Movements are Fluid and Coordinated No     Edema   Edema Mod edema noted R RF PIP as compared visually to L RF     AROM   Overall AROM  Deficits   AROM Assessment Site Finger   Right/Left Finger Right     Right Hand AROM   R Ring  MCP 0-90 90 Degrees  0* EXTENSION   R Ring PIP 0-100 36 Degrees  -21* extension   R Ring DIP 0-70 25 Degrees  -10* extension                  OT Treatments/Exercises (OP) - 12/10/16 0001      ADLs   ADL Comments Pt was educated in splinting wear and care as well as hygeine and to monitor for possible s/s of infection. Pt verbalized understanding of all of this in clinic today. Written instructions were provided and reviewed as well.      Fine Motor Coordination   Other Fine Motor Exercises Pt was not issued any HEP or A/ROM ex's at this time as order states OT for splinting only. OT will email MD office for clarification of orders as pt states he was "under the impression that I would" be getting ex's and some therapy to assist with increased functional use right RF.      Splinting   Splinting Pt presents to clinic with band-aid only. No drainage from previous pin site (pin removed yesterday in MD office, 12/09/16). Pt was educated in orders for OT and protective splinting. A custom static dorsal protective gutter splint was fabricated placing pt PIPJ of his R RF in approximately 30* flexion as per MD  orders. Splint currently includes DIPJ as pt stated that he was going camping at a music festival for next 4-5 days and preferred to keep it protected. DIP portion may be adjusted in the future to allow for ROM.  Splinting use, care and precautions were reviewed in clinic today and handout was issued as well. Pt will remove splint for hygeine and replace splint. He verbalized understanding of all of the above in clinic today. Pt was given extra compressive finger stockinette as well as velcro and strap should he need it. OT also discussed possiblity of using coban wrap to secure splint PRN as pt had used this in the past when he still had the pin in place.       We will be contacting the doctors office to see if they'd like you to begin any active movement or exercises to that right ring finger. Wear the splint until we follow up with you.   WEARING SCHEDULE:  Wear splint at ALL times except for hygiene care.  PURPOSE:  To prevent movement and for protection until injury can heal  CARE OF SPLINT:  Keep splint away from heat sources including: stove, radiator or furnace, or a car in sunlight. The splint can melt and will no longer fit you properly  Keep away from pets and children  Clean the splint with rubbing alcohol or luke warm water and soap. Do not use hot water. * During this time, make sure you also clean your hand/arm as instructed by your therapist and/or perform dressing changes as needed. Then dry hand/arm completely before replacing splint. (When cleaning hand/arm, keep it immobilized in same position until splint is replaced)  PRECAUTIONS/POTENTIAL PROBLEMS: *If you notice or experience increased pain, swelling, numbness, or a lingering reddened area from the splint: Try loosening the straps. If this does not solve the pain, swelling, numbness, contact your therapist immediately by calling [phone]. You must wear the splint for protection, but we will get you scheduled for  adjustments as quickly as possible.  (If only straps or hooks need to be replaced and NO adjustments to the splint need to be made, just call the office ahead and let them know you are coming in)  If you have any medical concerns or signs of infection, please call your doctor immediately          OT Education - 12/10/16 1123    Education provided Yes   Education Details OT assessment results, recommendations and protective R RF doral gutter splinting. Splint use, care and precautions. Need for OT to contact MD office for clarification of orders.   Person(s) Educated Patient   Methods Explanation;Demonstration;Verbal cues;Handout   Comprehension Verbalized understanding;Returned demonstration          OT Short Term Goals - 12/10/16 1229      OT SHORT TERM GOAL #1   Title Pt will be Mod I splinting use, care and precautions right RF (Due: 01/21/17)   Baseline Req initial vc's/demo for positioning and application   Time 6   Period Weeks   Status New     OT SHORT TERM GOAL #2   Title Pt will be Mod I edema control techniques R RF   Baseline Dependent   Time 6   Period Weeks   Status New     OT SHORT TERM GOAL #3   Title Pt will be Mod I scar management R RF   Baseline Dependent   Time 6   Period Weeks   Status New           OT Long Term Goals - 12/10/16 1230      OT LONG TERM GOAL #1   Title Pt will be I with HEP once  cleared by physician, for R RF (Due 03/04/17)   Baseline Dependent   Time 12   Period Weeks   Status New     OT LONG TERM GOAL #2   Title Pt will demonstrate functional grip strength R hand for daily and work related activities   Baseline Dependent/Unable   Time 12   Period Weeks   Status New     OT LONG TERM GOAL #3   Title Pt will demonstrate functional/general AROM for ADL's   Baseline 12/10/16: R RF MCP 0-90*; PIP -21-36*; -10-25* DIP   Time 12   Period Weeks   Status New               Plan - 12/10/16 1204    Clinical  Impression Statement Pt is a pleasant 21y/o RHD male whom presents today per Dr Melvyn Novas, following an injury to his right ring finger, that occured in March 2018, with DOS 10/22/16. His surgery included open treatment of right RF middle phalanx fracture involving the articular surface of the interphalangeal joint requiring internal fixation; open treatment of right ring finger proximal interphalangeal dislocation; and application of uniplane external fixation (09811, [id number], [id number]). He presents today with orders for OT for custom splinting only. Pt was w/o noted drainage from his right ring finger. Pt states that he had minimal drainage following pin removal yesterday (12/09/16). He was redressed with a bandaid and compressive stockinette and a custom dorsal protective gutter splint was then fabricated placing his PIP joint approximately 30* flexion. Pt was educated in splinting use, care and precautions in clinic today as wel las to [facility] for possible s/s of infection from pin site. He verbalized understanding of all of this in clinic today. Pt stated that he was also under the impresssion that he would doing some exercises and working on moving his finger, therefore OT will send an email asking for clarification of orders to see if/when gentle A/ROM may begin (if at all), strengthening and what are anticipated outcomes for ROM and functional use due to nature of injury. Pt currently presents with deficits in his ability to use his right dominant hand for grip, functional use and has moderate edema noted. Due to his young age and extensive surgery, He should benefit from out-pt OT to address splinting needs, HEP, pt education and upgrade of program based on clarification orders.    Occupational Profile and client history currently impacting functional performance R RF Open treatment of middle phalanx fracture involving the articular surface of the interphalangeal joint requiring internal fixation; Open treatment of  right RF proximal interpalaangeal dislocation; Application of uniplane external fixation; See EPIC for more details on PMH and more complete history.   Occupational performance deficits (Please refer to evaluation for details): ADL's;IADL's;Work;Leisure   Rehab Potential Fair   Current Impairments/barriers affecting progress: Good for pt age and motivational level; Fair due to extensive surgery/ingury to PIP joint right RF.   OT Frequency Other (comment)  Eval plus, up to 10 visits over 12 weeks    OT Duration Other (comment)  MCD pt - requesting up to 10 visits over next 12 weeks   OT Treatment/Interventions Self-care/ADL training;Therapeutic exercise;Parrafin;Neuromuscular education;Splinting;Fluidtherapy;Scar mobilization;Therapeutic exercises;Patient/family education;Ultrasound;Manual Therapy;Passive range of motion;Therapeutic activities   Plan Check MCD Authorization; Splint check and adjustment; Check email for MD clarification Orders and progress home program as able.   Clinical Decision Making Several treatment options, min-mod task modification necessary   Consulted and Agree with Plan of Care Patient  Patient will benefit from skilled therapeutic intervention in order to improve the following deficits and impairments:  Increased edema, Impaired flexibility, Pain, Decreased coordination, Decreased scar mobility, Decreased activity tolerance, Decreased endurance, Decreased range of motion, Decreased strength, Decreased knowledge of precautions, Impaired UE functional use  Visit Diagnosis: Other lack of coordination - Plan: Ot plan of care cert/re-cert  Pain in right finger(s) - Plan: Ot plan of care cert/re-cert  Localized edema - Plan: Ot plan of care cert/re-cert  Muscle weakness (generalized) - Plan: Ot plan of care cert/re-cert  Stiffness of joint - Plan: Ot plan of care cert/re-cert    Problem List There are no active problems to display for this  patient.   [address], Amy Beth Dixon, OTR/L 12/10/2016, 12:38 PM  [facility] [address] [place], [place], [address] Phone: [phone]   Fax:  [phone]  Name: Tommy Fernandez MRN: [id number] Date of Birth: 09/21/1995

## 2016-12-30 ENCOUNTER — Ambulatory Visit: Payer: Medicaid Other | Admitting: Occupational Therapy

## 2017-01-08 ENCOUNTER — Ambulatory Visit: Payer: Medicaid Other | Attending: Orthopedic Surgery | Admitting: Occupational Therapy

## 2017-01-08 DIAGNOSIS — R600 Localized edema: Secondary | ICD-10-CM | POA: Insufficient documentation

## 2017-01-08 DIAGNOSIS — M2560 Stiffness of unspecified joint, not elsewhere classified: Secondary | ICD-10-CM | POA: Insufficient documentation

## 2017-01-08 DIAGNOSIS — M6281 Muscle weakness (generalized): Secondary | ICD-10-CM | POA: Insufficient documentation

## 2017-01-08 DIAGNOSIS — M79644 Pain in right finger(s): Secondary | ICD-10-CM | POA: Insufficient documentation

## 2017-01-08 DIAGNOSIS — R278 Other lack of coordination: Secondary | ICD-10-CM | POA: Insufficient documentation

## 2017-01-12 ENCOUNTER — Ambulatory Visit: Payer: Medicaid Other | Admitting: Occupational Therapy

## 2017-01-12 DIAGNOSIS — M79644 Pain in right finger(s): Secondary | ICD-10-CM

## 2017-01-12 DIAGNOSIS — M6281 Muscle weakness (generalized): Secondary | ICD-10-CM

## 2017-01-12 DIAGNOSIS — R278 Other lack of coordination: Secondary | ICD-10-CM

## 2017-01-12 DIAGNOSIS — M2560 Stiffness of unspecified joint, not elsewhere classified: Secondary | ICD-10-CM

## 2017-01-12 DIAGNOSIS — R600 Localized edema: Secondary | ICD-10-CM | POA: Diagnosis present

## 2017-01-12 NOTE — Patient Instructions (Signed)
MP Flexion (Active Isolated)   Bend __ring____ finger at large knuckle, keeping other fingers straight. Do not bend tips. Repeat _10-15___ times. Do __4-6__ sessions per day.  AROM: PIP Flexion / Extension   Pinch bottom knuckle of __ring______ finger of hand to prevent bending. Actively bend middle knuckle until stretch is felt. Hold __5__ seconds. Relax. Straighten finger as far as possible. Repeat __10-15__ times per set. Do _4-6___ sessions per day.   AROM: DIP Flexion / Extension   PROM: Finger MP Joints   Passively bend ring finger of hand at big knuckle until stretch is felt. Hold _10___ seconds. Relax. Straighten finger as far as possible. Repeat __5__ times per set.  Do __4-6__ sessions per day.   PIP Flexion (Passive)   Use other hand to bend the middle joint of __ring____ finger gently Hold _10___ seconds. Repeat __5__ times. Do _3__ sessions per day.    PROM: Finger DIP Joints   Passively bend ___ring_____ finger(s) of  hand at tip joint until stretch is felt. Hold __10__ seconds. Relax. Straighten finger as far as possible. Repeat _5___ times per set.  Do __4-6__ sessions per day.  Copyright  VHI. All rights reserved.

## 2017-01-13 NOTE — Therapy (Signed)
[facility] [address] [place], [place], [address] Phone: [phone]   Fax:  [phone]  Occupational Therapy Treatment  Patient Details  Name: Tommy MarkerBrian L Violett MRN: [id number] Date of Birth: 04/30/1996 Referring Provider: Dr Bradly BienenstockFred Ortmann  Encounter Date: 01/12/2017      OT End of Session - 01/12/17 0936    Visit Number 2   Number of Visits 12   Authorization Type Medicaid approved 12 visits   Authorization Time Period 6/26-9/17/18   Authorization - Visit Number 1   Authorization - Number of Visits 12   OT Start Time 0935   OT Stop Time 1015   OT Time Calculation (min) 40 min   Activity Tolerance Patient tolerated treatment well   Behavior [facility] for tasks assessed/performed      Past Medical History:  Diagnosis Date  . Anxiety   . Asthma    as a child   . Bipolar and related disorder (HCC)   . Bradycardia 2015  . Constipation   . Depression   . Dyspnea    sometimes just standing up no exertion  . Family history of adverse reaction to anesthesia    takes more medication to get her to sleep  . GERD (gastroesophageal reflux disease)   . Syncope 2015   10/21/16- last time approx 1 year ago    Past Surgical History:  Procedure Laterality Date  . CLOSED REDUCTION FINGER WITH PERCUTANEOUS PINNING Right 10/22/2016   Procedure: RIGHT RING FINGER CLOSED REDUCTION AND PINNING, POSSIBLE OPEN REDUCTION EXTERNAL FIXATION (ORIF) FINGER;  Surgeon: Bradly BienenstockFred Ortmann, MD;  Location: [facility] OR;  Service: Orthopedics;  Laterality: Right;    There were no vitals filed for this visit.      Subjective Assessment - 01/12/17 0935    Subjective  Denies pain at rest   Currently in Pain? Yes   Pain Score 7    Pain Location --  right ring finger   Pain Orientation Right   Pain Descriptors / Indicators Aching   Pain Type Acute pain   Pain Onset More than a month ago   Pain Frequency Intermittent   Aggravating  Factors  movment   Pain Relieving Factors rest   Multiple Pain Sites No            [facility] OT Assessment - 01/13/17 0001      Precautions   Precautions Other (comment)   Precaution Comments cleared for A/ROM, P/ROM to IP and MP joints, MD does not expect a normal arc of motion at PIP joint due to severity of injury, so greater emphasis to be placed on DIP and MP ROM, splint for comfort          Fluidotherapy x 10 mins to right hand for pain relief, no adverse reactions. New strapping applied to splint and finger stockinette issued for comfort, splint appears to fit well. Instructed pt in A/ROM and P/ROM HEP, pt returned demonstration. He is only able to tolerate minimal ROM to PIP joint  Due to pain.. Buddy strap issued so that pt can perform AA/ROM to PIP joint. Pt to transfer care to [facility]. Email sent to Dr. Melvyn Novasrtmann to make him aware that pt has no-showed for previous visits and therefore exercises issued only today. Pt demonstrates significant  limitations in ROM as a result. Ice pack applied x 7 mins at end of session, no adverse reactions.  OT Education - 01/12/17 1734    Education provided Yes   Education Details splint wear for protection during activity,use of buddy loop for exercises, HEP for A/ROM, P/ROM-see pt instructions.   Person(s) Educated Patient   Methods Explanation;Demonstration;Verbal cues;Handout   Comprehension Verbalized understanding;Returned demonstration;Verbal cues required          OT Short Term Goals - 12/10/16 1229      OT SHORT TERM GOAL #1   Title Pt will be Mod I splinting use, care and precautions right RF (Due: 01/21/17)   Baseline Req initial vc's/demo for positioning and application   Time 6   Period Weeks   Status New     OT SHORT TERM GOAL #2   Title Pt will be Mod I edema control techniques R RF   Baseline Dependent   Time 6   Period Weeks   Status New     OT SHORT TERM GOAL #3   Title Pt will be Mod  I scar management R RF   Baseline Dependent   Time 6   Period Weeks   Status New           OT Long Term Goals - 12/10/16 1230      OT LONG TERM GOAL #1   Title Pt will be I with HEP once cleared by physician, for R RF (Due 03/04/17)   Baseline Dependent   Time 12   Period Weeks   Status New     OT LONG TERM GOAL #2   Title Pt will demonstrate functional grip strength R hand for daily and work related activities   Baseline Dependent/Unable   Time 12   Period Weeks   Status New     OT LONG TERM GOAL #3   Title Pt will demonstrate functional/general AROM for ADL's   Baseline 12/10/16: R RF MCP 0-90*; PIP -21-36*; -10-25* DIP   Time 12   Period Weeks   Status New               Plan - 01/13/17 1610    Clinical Impression Statement Pt is progressing slowly towards goals. He no-showed for his previous 2 visits due to transportation issues. Pt requests to transfer to [facility] outpatient as it is closer to his home. Pt demonstrates understanding of initial HEP. Pt demonstrates significant limitations in PIP A/ROM.   Current Impairments/barriers affecting progress: Good for pt age and motivational level; Fair due to extensive surgery/ingury to PIP joint right RF.   OT Frequency --  10 visits   OT Duration 12 weeks   OT Treatment/Interventions Self-care/ADL training;Therapeutic exercise;Parrafin;Neuromuscular education;Splinting;Fluidtherapy;Scar mobilization;Therapeutic exercises;Patient/family education;Ultrasound;Manual Therapy;Passive range of motion;Therapeutic activities   Plan review / reinforce HEP   Consulted and Agree with Plan of Care Patient      Patient will benefit from skilled therapeutic intervention in order to improve the following deficits and impairments:  Increased edema, Impaired flexibility, Pain, Decreased coordination, Decreased scar mobility, Decreased activity tolerance, Decreased endurance, Decreased range of motion, Decreased strength, Decreased  knowledge of precautions, Impaired UE functional use  Visit Diagnosis: Other lack of coordination  Pain in right finger(s)  Localized edema  Muscle weakness (generalized)  Stiffness of joint    Problem List There are no active problems to display for this patient.   RINE,KATHRYN 01/13/2017, 8:20 AM Keene Breath, OTR/L [phone] Phone: [phone] 8:24 AM 01/13/17 [facility] [address] [place], [place], [address] Phone: [phone]  Fax:  [phone]  Name: LOMAX POEHLER MRN: [id number] Date of Birth: [birth year]

## 2017-01-15 ENCOUNTER — Encounter (HOSPITAL_COMMUNITY): Payer: Self-pay | Admitting: Occupational Therapy

## 2017-01-15 ENCOUNTER — Ambulatory Visit (HOSPITAL_COMMUNITY): Payer: Medicaid Other | Attending: Orthopedic Surgery | Admitting: Occupational Therapy

## 2017-01-15 DIAGNOSIS — R278 Other lack of coordination: Secondary | ICD-10-CM | POA: Diagnosis present

## 2017-01-15 DIAGNOSIS — R600 Localized edema: Secondary | ICD-10-CM

## 2017-01-15 DIAGNOSIS — M79644 Pain in right finger(s): Secondary | ICD-10-CM | POA: Insufficient documentation

## 2017-01-15 DIAGNOSIS — M2560 Stiffness of unspecified joint, not elsewhere classified: Secondary | ICD-10-CM | POA: Insufficient documentation

## 2017-01-15 NOTE — Patient Instructions (Signed)
   AROM: Finger Flexion / Extension   Actively bend fingers of right hand. Start with knuckles furthest from palm, and slowly make a fist. Hold ____ seconds. Relax. Then straighten fingers as far as possible. Repeat ____ times per set. Do ____ sets per session. Do ____ sessions per day.  Copyright  VHI. All rights reserved.   Towel Crumpling Exercise   Begin with right palm down on piece of paper. Maintaining contact between surface and heel of hand, crumple paper into a ball. Repeat ____ times per set. Do ____ sets per session. Do ____ sessions per day.  Copyright  VHI. All rights reserved.     Towel Roll Squeeze   With right forearm resting on surface, gently squeeze towel. Repeat ____ times per set. Do ____ sets per session. Do ____ sessions per day.

## 2017-01-15 NOTE — Therapy (Addendum)
[place] [facility] [address] [place], [place], [address] Phone: [phone]   Fax:  [phone]  Occupational Therapy Treatment  Patient Details  Name: Tommy MarkerBrian L Valladolid MRN: [id number] Date of Birth: 09/15/1995 Referring Provider: Dr Bradly BienenstockFred Ortmann  Encounter Date: 01/15/2017      OT End of Session - 01/15/17 0959    Visit Number 3   Number of Visits 12   Date for OT Re-Evaluation 03/04/17   Authorization Type Medicaid approved 12 visits   Authorization Time Period 6/26-9/17/18   Authorization - Visit Number 3   Authorization - Number of Visits 12   OT Start Time 0859   OT Stop Time 0950   OT Time Calculation (min) 51 min   Activity Tolerance Patient tolerated treatment well   Behavior During Therapy [facility] for tasks assessed/performed      Past Medical History:  Diagnosis Date  . Anxiety   . Asthma    as a child   . Bipolar and related disorder (HCC)   . Bradycardia 2015  . Constipation   . Depression   . Dyspnea    sometimes just standing up no exertion  . Family history of adverse reaction to anesthesia    takes more medication to get her to sleep  . GERD (gastroesophageal reflux disease)   . Syncope 2015   10/21/16- last time approx 1 year ago    Past Surgical History:  Procedure Laterality Date  . CLOSED REDUCTION FINGER WITH PERCUTANEOUS PINNING Right 10/22/2016   Procedure: RIGHT RING FINGER CLOSED REDUCTION AND PINNING, POSSIBLE OPEN REDUCTION EXTERNAL FIXATION (ORIF) FINGER;  Surgeon: Bradly BienenstockFred Ortmann, MD;  Location: [facility] OR;  Service: Orthopedics;  Laterality: Right;    There were no vitals filed for this visit.      Subjective Assessment - 01/15/17 0904    Subjective  S: It really hurt when I was using the "finger buddy" to try and make it move.    Pertinent History Pt is a 21 year old male s/p surgery of a closed displaced fracture of middle phalanx of right ring finger resulting in decreased ROM and grip strength with  increased swelling.   Patient Stated Goals Pt reports wanting to have use of his finger back.   Currently in Pain? No/denies           [facility] - 01/15[facility]17 0914      Assessment   Diagnosis R RF middle phalanx fracture w. PIP joint dislocation requiring internal fixation     Precautions   Precautions Other (comment)   Precaution Comments cleared for A/ROM, P/ROM to IP and MP joints, MD does not expect a normal arc of motion at PIP joint due to severity of injury, so greater emphasis to be placed on DIP and MP ROM, splint for comfort                  OT Treatments/Exercises (OP) - 01/15/17 0914      Exercises   Exercises Hand     Hand Exercises   Theraputty Flatten;Roll;Grip;Pinch;Locate Pegs   Theraputty - Flatten yellow-pt completed pulling putty towards body with whole hand 10X, then completed pulling putty towards body with just ring finger 5X.   Theraputty - Roll yellow   Theraputty - Grip yellow   Theraputty - Pinch yellow-pt completed pinch activity with thumb and ring finger   Theraputty - Locate Pegs yellow-pt placed 10 pegs pushing in with ring finger and removing with  pinch of thumb and ring finger   Other Hand Exercises Pt completed towel crunch 10X.   Other Hand Exercises Pt completed finger taps 10X with flat hand, 10X with slightly flexed hand.     Fine Motor Coordination   Fine Motor Coordination Manipulating coins   Manipulating coins Pt completed picking up sequence chips one at a time with thumb and ring finger, manipulating into palm and back out of palm.     Manual Therapy   Manual Therapy Edema management;Passive ROM   Manual therapy comments completed separately from therapeutic exercises   Edema Management Retrograde massage and edema management techniques to right 4th finger to reduce edema and improve ROM   Passive ROM Sustained passive stretching to right 4th finger IP joints to decrease stiffness and improve ROM and joint  mobility                OT Education - 01/15/17 0957    Education provided Yes   Education Details provided pt with HEP for A/ROM of finger extension/flexion and towel crumble/grip   Person(s) Educated Patient   Methods Explanation;Demonstration;Verbal cues;Handout   Comprehension Verbalized understanding;Returned demonstration          OT Short Term Goals - 01/15/17 1017      OT SHORT TERM GOAL #1   Title Pt will be Mod I splinting use, care and precautions right RF (Due: 01/21/17)   Baseline Req initial vc's/demo for positioning and application   Time 6   Period Weeks   Status On-going     OT SHORT TERM GOAL #2   Title Pt will be Mod I edema control techniques R RF   Baseline Dependent   Time 6   Period Weeks   Status On-going     OT SHORT TERM GOAL #3   Title Pt will be Mod I scar management R RF   Baseline Dependent   Time 6   Period Weeks   Status On-going           OT Long Term Goals - 01/15/17 1017      OT LONG TERM GOAL #1   Title Pt will be I with HEP once cleared by physician, for R RF (Due 03/04/17)   Baseline Dependent   Time 12   Period Weeks   Status On-going     OT LONG TERM GOAL #2   Title Pt will demonstrate functional grip strength R hand for daily and work related activities   Baseline Dependent/Unable   Time 12   Period Weeks   Status On-going     OT LONG TERM GOAL #3   Title Pt will demonstrate functional/general AROM for ADL's   Baseline 12/10/16: R RF MCP 0-90*; PIP -21-36*; -10-25* DIP   Time 12   Period Weeks   Status On-going               Plan - 01/15/17 1103    Clinical Impression Statement A: Pt presented with decreased ROM of PIP joint on 4th finger causing increased pain during P/ROM and A/ROM. Completed finger taps and theraputty exercises with VC needed for form and technique of flexion for 4th digit. Sequence chip manipulation activity presented as mod difficulty for pt, requiring use of other fingers to  assist 4th digit.   Plan P: Continue with P/ROM and A/ROM hand and finger exercises. Focus on 4th digit PIP joint flexion.      Patient will benefit from skilled therapeutic intervention in order  to improve the following deficits and impairments:  Increased edema, Impaired flexibility, Pain, Decreased coordination, Decreased scar mobility, Decreased activity tolerance, Decreased endurance, Decreased range of motion, Decreased strength, Decreased knowledge of precautions, Impaired UE functional use  Visit Diagnosis: Pain in right finger(s)  Stiffness of joint  Localized edema  Other lack of coordination    Problem List There are no active problems to display for this patient.   Ricci Barker, OT Student [phone] 01/15/2017, 11:09 AM  [facility] [address] [place], [place], [address] Phone: [phone]   Fax:  [phone]  Name: SUJAY GRUNDMAN MRN: [id number] Date of Birth: [birth year]    Note reviewed by qualified practitioner and accurately reflects treatment session.  Ezra Sites, OTR/L  [phone] 01/15/2017

## 2017-01-21 ENCOUNTER — Ambulatory Visit (HOSPITAL_COMMUNITY): Payer: Medicaid Other

## 2017-01-26 ENCOUNTER — Telehealth (HOSPITAL_COMMUNITY): Payer: Self-pay | Admitting: General Practice

## 2017-01-26 NOTE — Telephone Encounter (Signed)
01/26/17 left a message to offer another appt this week... From wait list

## 2017-01-28 ENCOUNTER — Ambulatory Visit (HOSPITAL_COMMUNITY): Payer: Medicaid Other

## 2017-01-28 ENCOUNTER — Encounter (HOSPITAL_COMMUNITY): Payer: Self-pay

## 2017-01-28 DIAGNOSIS — M79644 Pain in right finger(s): Secondary | ICD-10-CM

## 2017-01-28 DIAGNOSIS — M2560 Stiffness of unspecified joint, not elsewhere classified: Secondary | ICD-10-CM

## 2017-01-28 NOTE — Therapy (Signed)
[place] [facility] [address] [place], [place], [address] Phone: [phone]   Fax:  [phone]  Occupational Therapy Treatment And mini reassessment Patient Details  Name: Tommy Fernandez MRN: [id number] Date of Birth: 1995-11-22 Referring Provider: Dr Bradly Bienenstock  Encounter Date: 01/28/2017      OT End of Session - 01/28/17 1301    Visit Number 4   Number of Visits 12   Date for OT Re-Evaluation 03/04/17   Authorization Type Medicaid approved 12 visits   Authorization Time Period 6/26-9/17/18   Authorization - Visit Number 4   Authorization - Number of Visits 12   OT Start Time 1105   OT Stop Time 1153   OT Time Calculation (min) 48 min   Activity Tolerance Patient tolerated treatment well   Behavior During Therapy [facility] for tasks assessed/performed      Past Medical History:  Diagnosis Date  . Anxiety   . Asthma    as a child   . Bipolar and related disorder (HCC)   . Bradycardia 2015  . Constipation   . Depression   . Dyspnea    sometimes just standing up no exertion  . Family history of adverse reaction to anesthesia    takes more medication to get her to sleep  . GERD (gastroesophageal reflux disease)   . Syncope 2015   10/21/16- last time approx 1 year ago    Past Surgical History:  Procedure Laterality Date  . CLOSED REDUCTION FINGER WITH PERCUTANEOUS PINNING Right 10/22/2016   Procedure: RIGHT RING FINGER CLOSED REDUCTION AND PINNING, POSSIBLE OPEN REDUCTION EXTERNAL FIXATION (ORIF) FINGER;  Surgeon: Bradly Bienenstock, MD;  Location: [facility] OR;  Service: Orthopedics;  Laterality: Right;    There were no vitals filed for this visit.      Subjective Assessment - 01/28/17 1153    Subjective  S: I stopped wearing my splint because it wasn't helping.   Currently in Pain? No/denies            [facility] OT Assessment - 01/28/17 1110      Assessment   Diagnosis R RF middle phalanx fracture w. PIP joint dislocation requiring  internal fixation     Precautions   Precautions Other (comment)   Precaution Comments cleared for A/ROM, P/ROM to IP and MP joints, MD does not expect a normal arc of motion at PIP joint due to severity of injury, so greater emphasis to be placed on DIP and MP ROM, splint for comfort     Coordination   9 Hole Peg Test Right;Left   Right 9 Hole Peg Test 22.06  using ring finger and thumb 2-point pinch   Left 9 Hole Peg Test 21.12  using ring finger and thumb 2-point pinch     ROM / Strength   AROM / PROM / Strength AROM;PROM;Strength     AROM   AROM Assessment Site --   Right/Left Finger --     PROM   PROM Assessment Site --   Right/Left Finger --     Strength   Strength Assessment Site Hand   Right/Left hand Right;Left   Right Hand Grip (lbs) 60   Right Hand Lateral Pinch 18 lbs   Right Hand 3 Point Pinch 6 lbs  (used middle and ring finger)   Left Hand Grip (lbs) 105   Left Hand Lateral Pinch 19 lbs   Left Hand 3 Point Pinch 17 lbs  (used middle and ring finger)  Right Hand AROM   R Ring PIP 0-100 36 Degrees  extension: -18  previous flexion: same extension: -21   R Ring DIP 0-70 50 Degrees  extension: -6  previous: flexion: 25 extension: -10     Right Hand PROM   R Ring PIP 0-100 42 Degrees  extension: -15   R Ring DIP 0-70 62 Degrees  extension: 0          OT Treatments/Exercises (OP) - 01/28/17 1207      Exercises   Exercises Hand     Hand Exercises   Joint Blocking Exercises Therapist completed joint blocking for PIP and DIP flexion 15X each (P/ROM).   Theraputty Grip;Pinch   Theraputty - Grip yellow   Theraputty - Pinch yellow-pt completed pinch activity with thumb and ring finger   Other Hand Exercises Composite finger flexion and extension A/ROM 15X.                 OT Education - 01/28/17 1213    Education provided Yes   Education Details provided pt with HEP for theraputty; educated pt on edema and scar management   Person(s)  Educated Patient   Methods Explanation;Demonstration;Verbal cues;Handout   Comprehension Verbalized understanding;Returned demonstration          OT Short Term Goals - 01/28/17 1306      OT SHORT TERM GOAL #1   Title Pt will be modified independent with splinting use, care and precautions right ring finger (Due: 01/21/17)   Baseline --   Time 6   Period Weeks   Status Achieved     OT SHORT TERM GOAL #2   Title Pt will be modified independent with edema control techniques rt ring finger.   Baseline --   Time 6   Period Weeks   Status Achieved     OT SHORT TERM GOAL #3   Title Pt will be modified independent with scar management rt ring finger.   Baseline --   Time 6   Period Weeks   Status Achieved     OT SHORT TERM GOAL #4   Title Patient will increase right pinch strength by 5# to increase ability to complete fine motor tasks using right hand as dominant.   Time 4   Period Weeks   Status New     OT SHORT TERM GOAL #5   Title Patient will increase right grip strength by 15# to increase ability to hold onto items such as cell phone easier in order to complete ADL tasks.   Time 4   Period Weeks   Status New           OT Long Term Goals - 01/28/17 1315      OT LONG TERM GOAL #1   Title Pt will be indepent with HEP once cleared by physician, for rt ring finger (Due 03/04/17)   Time 12   Period Weeks   Status Achieved     OT LONG TERM GOAL #2   Title Pt will demonstrate increased grip strength in rt hand by 25# for daily and work related activities.   Time 12   Period Weeks   Status Revised     OT LONG TERM GOAL #3   Title Pt will demonstrate functional A/ROM in PIP and DIP joints of the right ring finger to assist in ADL tasks.   Time 12   Period Weeks   Status Revised     OT LONG TERM GOAL #4  Title Patient will increase right 3-point pinch strength by 10# to assist with in-hand manipulation tasks.   Time 12   Period Weeks   Status New                Plan - 01/28/17 1302    Clinical Impression Statement A: Completed mini-reassessment this session. Pt showed an increase in ring finger PIP and DIP flexion/extension A/ROM. Completed measurements for grip and pinch strength as well as coordination. Pt presents with decreased grip and pinch strength. Created short and long term goals focusing on grip and pinch strength deficits. Pt completed A/ROM exercises but was limited by pain. During theraputty exercises, VC needed from therapist for form and technique. Pt achieved three short term goals and one long term goal. Therapist revised two long term goals to fit updated needs.   Plan P: Continue with P/ROM and A/ROM finger exercises. Complete hand gripper activity.   OT Home Exercise Plan 01/28/17: Theraputty exercises      Patient will benefit from skilled therapeutic intervention in order to improve the following deficits and impairments:  Increased edema, Impaired flexibility, Pain, Decreased coordination, Decreased scar mobility, Decreased activity tolerance, Decreased endurance, Decreased range of motion, Decreased strength, Decreased knowledge of precautions, Impaired UE functional use  Visit Diagnosis: Pain in right finger(s)  Stiffness of joint    Problem List There are no active problems to display for this patient.   Ricci Barkerhristina Aarian Griffie, OT Student [phone] 01/28/2017, 1:24 PM  [place] [facility] [address] [place], [place], [address] Phone: [phone]   Fax:  [phone]  Name: Tommy MarkerBrian L Crowson MRN: [id number] Date of Birth: 07/30/1995   Note reviewed by clinical instructor and accurately reflects treatment session.   Limmie PatriciaLaura Essenmacher, OTR/L,CBIS  [phone]

## 2017-01-28 NOTE — Patient Instructions (Signed)
Home Exercises Program Theraputty Exercises  Do the following exercises 2 times a day using your affected hand.  1. Roll putty into a ball.  2. Make into a pancake.  3. Roll putty into a roll.  4. Pinch along log with first finger and thumb.   5. Make into a ball.  6. Roll it back into a log.   7. Pinch using thumb and side of first finger.  8. Roll into a ball, then flatten into a pancake.  9. Using your fingers, make putty into a mountain.  10. Using all fingers, squeeze ball of putty 10 times.

## 2017-02-05 ENCOUNTER — Encounter (HOSPITAL_COMMUNITY): Payer: Self-pay

## 2017-02-05 ENCOUNTER — Ambulatory Visit (HOSPITAL_COMMUNITY): Payer: Medicaid Other | Attending: Orthopedic Surgery

## 2017-02-05 DIAGNOSIS — M79644 Pain in right finger(s): Secondary | ICD-10-CM | POA: Insufficient documentation

## 2017-02-05 DIAGNOSIS — R600 Localized edema: Secondary | ICD-10-CM | POA: Diagnosis present

## 2017-02-05 DIAGNOSIS — M6281 Muscle weakness (generalized): Secondary | ICD-10-CM | POA: Insufficient documentation

## 2017-02-05 DIAGNOSIS — M2560 Stiffness of unspecified joint, not elsewhere classified: Secondary | ICD-10-CM | POA: Insufficient documentation

## 2017-02-05 DIAGNOSIS — R278 Other lack of coordination: Secondary | ICD-10-CM | POA: Diagnosis present

## 2017-02-05 NOTE — Therapy (Signed)
[place] [facility] [address] [place], [place], [address] Phone: [phone]   Fax:  [phone]  Occupational Therapy Treatment  Patient Details  Name: Tommy Fernandez MRN: [id number] Date of Birth: 01/23/1996 Referring Provider: Dr Bradly BienenstockFred Ortmann  Encounter Date: 02/05/2017      OT End of Session - 02/05/17 1155    Visit Number 5   Number of Visits 12   Date for OT Re-Evaluation 03/04/17   Authorization Type Medicaid approved 12 visits   Authorization Time Period 6/26-9/17/18   Authorization - Visit Number 5   Authorization - Number of Visits 12   OT Start Time 1115   OT Stop Time 1157   OT Time Calculation (min) 42 min   Activity Tolerance Patient tolerated treatment well   Behavior During Therapy [facility] for tasks assessed/performed      Past Medical History:  Diagnosis Date  . Anxiety   . Asthma    as a child   . Bipolar and related disorder (HCC)   . Bradycardia 2015  . Constipation   . Depression   . Dyspnea    sometimes just standing up no exertion  . Family history of adverse reaction to anesthesia    takes more medication to get her to sleep  . GERD (gastroesophageal reflux disease)   . Syncope 2015   10/21/16- last time approx 1 year ago    Past Surgical History:  Procedure Laterality Date  . CLOSED REDUCTION FINGER WITH PERCUTANEOUS PINNING Right 10/22/2016   Procedure: RIGHT RING FINGER CLOSED REDUCTION AND PINNING, POSSIBLE OPEN REDUCTION EXTERNAL FIXATION (ORIF) FINGER;  Surgeon: Bradly BienenstockFred Ortmann, MD;  Location: [facility] OR;  Service: Orthopedics;  Laterality: Right;    There were no vitals filed for this visit.      Subjective Assessment - 02/05/17 1132    Subjective  S: I've noticed more movement over the last week.   Currently in Pain? No/denies            [facility] Assessment - 02/05/17 1132      Assessment   Diagnosis R RF middle phalanx fracture w. PIP joint dislocation requiring internal fixation     Precautions   Precautions Other (comment)   Precaution Comments cleared for A/ROM, P/ROM to IP and MP joints, MD does not expect a normal arc of motion at PIP joint due to severity of injury, so greater emphasis to be placed on DIP and MP ROM, splint for comfort                  OT Treatments/Exercises (OP) - 02/05/17 1126      Exercises   Exercises Hand     Hand Exercises   Joint Blocking Exercises Therapist completed joint blocking for PIP and DIP flexion 15X each (P/ROM).   Theraputty Flatten;Grip;Pinch;Locate Pegs   Theraputty - Flatten yellow-pt completed pulling putty towards body with whole hand 10X, then completed pullinh putty towards body with just ring finger 10X.   Theraputty - Grip yellow   Theraputty - Pinch yellow-pt completed pinch activity with thumb and ring finger   Theraputty - Locate Pegs yellow-pt placed 15 pegs pushing in with ring finger and removing with pinch of thumb and ring finger   Hand Gripper with Large Beads all beads with 55#   Hand Gripper with Medium Beads all beads with 55#   Hand Gripper with Small Beads all beads with 55#   Other Hand Exercises Composite finger flexion and  extension A/ROM 15X.    Other Hand Exercises Pt completed pinch tree using thumb and ring finger for pinch. Completed red, blue, green and yellow clothes pins putting on and removing.     Fine Motor Coordination   Other Fine Motor Exercises Pt completed finger taps 10X with flat hand, 10X with slightly flexed hand.                OT Education - 02/05/17 1133    Education provided No          OT Short Term Goals - 02/05/17 1157      OT SHORT TERM GOAL #1   Title Pt will be modified independent with splinting use, care and precautions right ring finger (Due: 01/21/17)   Time 6   Period Weeks   Status --     OT SHORT TERM GOAL #2   Title Pt will be modified independent with edema control techniques rt ring finger.   Time 6   Period Weeks   Status  --     OT SHORT TERM GOAL #3   Title Pt will be modified independent with scar management rt ring finger.   Time 6   Period Weeks   Status --     OT SHORT TERM GOAL #4   Title Patient will increase right pinch strength by 5# to increase ability to complete fine motor tasks using right hand as dominant.   Time 4   Period Weeks   Status On-going     OT SHORT TERM GOAL #5   Title Patient will increase right grip strength by 15# to increase ability to hold onto items such as cell phone easier in order to complete ADL tasks.   Time 4   Period Weeks   Status On-going           OT Long Term Goals - 02/05/17 1158      OT LONG TERM GOAL #1   Title Pt will be indepent with HEP once cleared by physician, for rt ring finger (Due 03/04/17)   Time 12   Period Weeks     OT LONG TERM GOAL #2   Title Pt will demonstrate increased grip strength in rt hand by 25# for daily and work related activities.   Time 12   Period Weeks   Status On-going     OT LONG TERM GOAL #3   Title Pt will demonstrate functional A/ROM in PIP and DIP joints of the right ring finger to assist in ADL tasks.   Time 12   Period Weeks   Status On-going     OT LONG TERM GOAL #4   Title Patient will increase right 3-point pinch strength by 10# to assist with in-hand manipulation tasks.   Time 12   Period Weeks   Status On-going               Plan - 02/05/17 1158    Clinical Impression Statement A: Completed P/ROM and A/ROM movements for the DIP and PIP ring finger joints. Pt showing notable progress in A/ROM of both joints. Added the pinch tree and hand gripper with VC needed for form and technique. Pt had mod to max difficulty with the last half of the smaller beads for the hand gripper as well as with the blue clothes pin during the pinch tree.   Plan P: Continue with P/ROM and A/ROM finger exercises. Continue with pinch tree and hand gripper exercises.  Patient will benefit from skilled  therapeutic intervention in order to improve the following deficits and impairments:  Increased edema, Impaired flexibility, Pain, Decreased coordination, Decreased scar mobility, Decreased activity tolerance, Decreased endurance, Decreased range of motion, Decreased strength, Decreased knowledge of precautions, Impaired UE functional use  Visit Diagnosis: Pain in right finger(s)  Stiffness of joint  Localized edema  Other lack of coordination    Problem List There are no active problems to display for this patient.   Ricci Barker, OT Student [phone] 02/05/2017, 12:03 PM  [place] [facility] [address] [place], [place], [address] Phone: [phone]   Fax:  [phone]  Name: Tommy Fernandez MRN: [id number] Date of Birth: 12-09-1995    Note reviewed by clinical instructor and accurately reflects treatment session.   Limmie Patricia, OTR/L,CBIS  [phone]

## 2017-02-05 NOTE — Therapy (Deleted)
[place] [facility] [address] [place], [place], [address] Phone: [phone]   Fax:  [phone]  Occupational Therapy Treatment  Patient Details  Name: Tommy Fernandez MRN: [id number] Date of Birth: 09/22/1995 Referring Provider: Dr Bradly BienenstockFred Ortmann  Encounter Date: 02/05/2017      OT End of Session - 02/05/17 1155    Visit Number 5   Number of Visits 12   Date for OT Re-Evaluation 03/04/17   Authorization Type Medicaid approved 12 visits   Authorization Time Period 6/26-9/17/18   Authorization - Visit Number 5   Authorization - Number of Visits 12   OT Start Time 1115   OT Stop Time 1157   OT Time Calculation (min) 42 min   Activity Tolerance Patient tolerated treatment well   Behavior During Therapy [facility] for tasks assessed/performed      Past Medical History:  Diagnosis Date  . Anxiety   . Asthma    as a child   . Bipolar and related disorder (HCC)   . Bradycardia 2015  . Constipation   . Depression   . Dyspnea    sometimes just standing up no exertion  . Family history of adverse reaction to anesthesia    takes more medication to get her to sleep  . GERD (gastroesophageal reflux disease)   . Syncope 2015   10/21/16- last time approx 1 year ago    Past Surgical History:  Procedure Laterality Date  . CLOSED REDUCTION FINGER WITH PERCUTANEOUS PINNING Right 10/22/2016   Procedure: RIGHT RING FINGER CLOSED REDUCTION AND PINNING, POSSIBLE OPEN REDUCTION EXTERNAL FIXATION (ORIF) FINGER;  Surgeon: Bradly BienenstockFred Ortmann, MD;  Location: [facility] OR;  Service: Orthopedics;  Laterality: Right;    There were no vitals filed for this visit.      Subjective Assessment - 02/05/17 1132    Subjective  S: I've noticed more movement over the last week.   Currently in Pain? No/denies            [facility] Assessment - 02/05/17 1132      Assessment   Diagnosis R RF middle phalanx fracture w. PIP joint dislocation requiring internal fixation     Precautions   Precautions Other (comment)   Precaution Comments cleared for A/ROM, P/ROM to IP and MP joints, MD does not expect a normal arc of motion at PIP joint due to severity of injury, so greater emphasis to be placed on DIP and MP ROM, splint for comfort                  OT Treatments/Exercises (OP) - 02/05/17 1126      Exercises   Exercises Hand     Hand Exercises   Joint Blocking Exercises Therapist completed joint blocking for PIP and DIP flexion 15X each (P/ROM).   Theraputty Flatten;Grip;Pinch;Locate Pegs   Theraputty - Flatten yellow-pt completed pulling putty towards body with whole hand 10X, then completed pullinh putty towards body with just ring finger 10X.   Theraputty - Grip yellow   Theraputty - Pinch yellow-pt completed pinch activity with thumb and ring finger   Theraputty - Locate Pegs yellow-pt placed 15 pegs pushing in with ring finger and removing with pinch of thumb and ring finger   Hand Gripper with Large Beads all beads with 55#   Hand Gripper with Medium Beads all beads with 55#   Hand Gripper with Small Beads all beads with 55#   Other Hand Exercises Composite finger flexion and  extension A/ROM 15X.    Other Hand Exercises Pt completed pinch tree using thumb and ring finger for pinch. Completed red, blue, green and yellow clothes pins putting on and removing.     Fine Motor Coordination   Other Fine Motor Exercises Pt completed finger taps 10X with flat hand, 10X with slightly flexed hand.                OT Education - 02/05/17 1133    Education provided No          OT Short Term Goals - 02/05/17 1157      OT SHORT TERM GOAL #1   Title Pt will be modified independent with splinting use, care and precautions right ring finger (Due: 01/21/17)   Time 6   Period Weeks   Status --     OT SHORT TERM GOAL #2   Title Pt will be modified independent with edema control techniques rt ring finger.   Time 6   Period Weeks   Status  --     OT SHORT TERM GOAL #3   Title Pt will be modified independent with scar management rt ring finger.   Time 6   Period Weeks   Status --     OT SHORT TERM GOAL #4   Title Patient will increase right pinch strength by 5# to increase ability to complete fine motor tasks using right hand as dominant.   Time 4   Period Weeks   Status On-going     OT SHORT TERM GOAL #5   Title Patient will increase right grip strength by 15# to increase ability to hold onto items such as cell phone easier in order to complete ADL tasks.   Time 4   Period Weeks   Status On-going           OT Long Term Goals - 02/05/17 1158      OT LONG TERM GOAL #1   Title Pt will be indepent with HEP once cleared by physician, for rt ring finger (Due 03/04/17)   Time 12   Period Weeks     OT LONG TERM GOAL #2   Title Pt will demonstrate increased grip strength in rt hand by 25# for daily and work related activities.   Time 12   Period Weeks   Status On-going     OT LONG TERM GOAL #3   Title Pt will demonstrate functional A/ROM in PIP and DIP joints of the right ring finger to assist in ADL tasks.   Time 12   Period Weeks   Status On-going     OT LONG TERM GOAL #4   Title Patient will increase right 3-point pinch strength by 10# to assist with in-hand manipulation tasks.   Time 12   Period Weeks   Status On-going               Plan - 02/05/17 1158    Clinical Impression Statement A: Completed P/ROM and A/ROM movements for the DIP and PIP ring finger joints. Pt showing notable progress in A/ROM of both joints. Added the pinch tree and hand gripper with VC needed for form and technique. Pt had mod to max difficulty with the last half of the smaller beads for the hand gripper as well as with the blue clothes pin during the pinch tree.   Plan P: Continue with P/ROM and A/ROM finger exercises. Continue with pinch tree and hand gripper exercises.  Patient will benefit from skilled  therapeutic intervention in order to improve the following deficits and impairments:  Increased edema, Impaired flexibility, Pain, Decreased coordination, Decreased scar mobility, Decreased activity tolerance, Decreased endurance, Decreased range of motion, Decreased strength, Decreased knowledge of precautions, Impaired UE functional use  Visit Diagnosis: Pain in right finger(s)  Stiffness of joint  Localized edema  Other lack of coordination    Problem List There are no active problems to display for this patient.   Kerim Statzer, Charisse MarchLaura D 02/05/2017, 12:01 PM  [place] [facility] [address] [place], [place], [address] Phone: [phone]   Fax:  [phone]  Name: Tommy Fernandez MRN: [id number] Date of Birth: 03/01/1996

## 2017-02-11 ENCOUNTER — Ambulatory Visit (HOSPITAL_COMMUNITY): Payer: Medicaid Other

## 2017-02-11 ENCOUNTER — Encounter (HOSPITAL_COMMUNITY): Payer: Self-pay

## 2017-02-11 DIAGNOSIS — M79644 Pain in right finger(s): Secondary | ICD-10-CM

## 2017-02-11 DIAGNOSIS — R278 Other lack of coordination: Secondary | ICD-10-CM

## 2017-02-11 DIAGNOSIS — R600 Localized edema: Secondary | ICD-10-CM

## 2017-02-11 DIAGNOSIS — M2560 Stiffness of unspecified joint, not elsewhere classified: Secondary | ICD-10-CM

## 2017-02-11 NOTE — Therapy (Signed)
[facility] [facility] [address] [place], [place], [address] Phone: [phone]   Fax:  [phone]  Occupational Therapy Treatment  Patient Details  Name: Tommy Fernandez MRN: [id number] Date of Birth: 03/04/1996 Referring Provider: Dr Bradly BienenstockFred Ortmann  Encounter Date: 02/11/2017      OT End of Session - 02/11/17 1205    Visit Number 6   Number of Visits 12   Date for OT Re-Evaluation 03/04/17   Authorization Type Medicaid approved 12 visits   Authorization Time Period 6/26-9/17/18   Authorization - Visit Number 6   Authorization - Number of Visits 12   OT Start Time 1120   OT Stop Time 1201   OT Time Calculation (min) 41 min   Activity Tolerance Patient tolerated treatment well   Behavior During Therapy [facility] for tasks assessed/performed      Past Medical History:  Diagnosis Date  . Anxiety   . Asthma    as a child   . Bipolar and related disorder (HCC)   . Bradycardia 2015  . Constipation   . Depression   . Dyspnea    sometimes just standing up no exertion  . Family history of adverse reaction to anesthesia    takes more medication to get her to sleep  . GERD (gastroesophageal reflux disease)   . Syncope 2015   10/21/16- last time approx 1 year ago    Past Surgical History:  Procedure Laterality Date  . CLOSED REDUCTION FINGER WITH PERCUTANEOUS PINNING Right 10/22/2016   Procedure: RIGHT RING FINGER CLOSED REDUCTION AND PINNING, POSSIBLE OPEN REDUCTION EXTERNAL FIXATION (ORIF) FINGER;  Surgeon: Bradly BienenstockFred Ortmann, MD;  Location: [facility] OR;  Service: Orthopedics;  Laterality: Right;    There were no vitals filed for this visit.      Subjective Assessment - 02/11/17 1130    Subjective  S: I punched another wall this morning.   Currently in Pain? No/denies            [facility] Assessment - 02/11/17 1136      Assessment   Diagnosis R RF middle phalanx fracture w. PIP joint dislocation requiring internal fixation     Precautions   Precautions Other (comment)   Precaution Comments cleared for A/ROM, P/ROM to IP and MP joints, MD does not expect a normal arc of motion at PIP joint due to severity of injury, so greater emphasis to be placed on DIP and MP ROM, splint for comfort                  OT Treatments/Exercises (OP) - 02/11/17 1137      Exercises   Exercises Hand     Hand Exercises   Joint Blocking Exercises Therapist completed joint blocking for MCP and DIP flexion 15X each (P/ROM).   Hand Gripper with Large Beads all beads with 61#   Hand Gripper with Medium Beads all beads with 61#   Hand Gripper with Small Beads all beads with 61#   Sponges Pt used red clothes pin and 2 point pinch with ring finger and thumb to stack hard sponges as high as possible: 5,    Other Hand Exercises Composite finger flexion and extension A/ROM 15X.    Other Hand Exercises Pt completed pinch tree using thumb and ring finger for pinch. Completed red, blue, green and yellow clothes pins putting on and removing.     Fine Motor Coordination   Other Fine Motor Exercises Pt completed finger taps 10X  with flat hand, 10X with slightly flexed hand.                OT Education - 02/11/17 1135    Education provided Yes   Education Details Educated pt on anger management tactics   Person(s) Educated Patient   Methods Explanation   Comprehension Verbalized understanding          OT Short Term Goals - 02/05/17 1157      OT SHORT TERM GOAL #1   Title Pt will be modified independent with splinting use, care and precautions right ring finger (Due: 01/21/17)   Time 6   Period Weeks   Status --     OT SHORT TERM GOAL #2   Title Pt will be modified independent with edema control techniques rt ring finger.   Time 6   Period Weeks   Status --     OT SHORT TERM GOAL #3   Title Pt will be modified independent with scar management rt ring finger.   Time 6   Period Weeks   Status --     OT SHORT TERM GOAL #4    Title Patient will increase right pinch strength by 5# to increase ability to complete fine motor tasks using right hand as dominant.   Time 4   Period Weeks   Status On-going     OT SHORT TERM GOAL #5   Title Patient will increase right grip strength by 15# to increase ability to hold onto items such as cell phone easier in order to complete ADL tasks.   Time 4   Period Weeks   Status On-going           OT Long Term Goals - 02/05/17 1158      OT LONG TERM GOAL #1   Title Pt will be indepent with HEP once cleared by physician, for rt ring finger (Due 03/04/17)   Time 12   Period Weeks     OT LONG TERM GOAL #2   Title Pt will demonstrate increased grip strength in rt hand by 25# for daily and work related activities.   Time 12   Period Weeks   Status On-going     OT LONG TERM GOAL #3   Title Pt will demonstrate functional A/ROM in PIP and DIP joints of the right ring finger to assist in ADL tasks.   Time 12   Period Weeks   Status On-going     OT LONG TERM GOAL #4   Title Patient will increase right 3-point pinch strength by 10# to assist with in-hand manipulation tasks.   Time 12   Period Weeks   Status On-going               Plan - 02/11/17 1208    Clinical Impression Statement A: Completed P/ROM and A/ROM movement for the MCP and PIP finger joints. Pt completed pinch tree and hand gripper activiites, increasing hand gripper weight. Added stacking sponges with 2 point pinch (ring finger and thumb), presented as mod to max difficulty. Pt reports no pain at start of session however pt experienced increased pain during exercises due to reported actions taken this morning. Pt punched another wall due to anger with same hand as well as opposite hand. Pt's hand was visually red around MCP joints,   Plan P: Continue with P/ROM and A/ROM finger exercises. Complete sponge stacking task with ring finger and thumb as well as theraputty task. Discuss and provide patient  with  anger management techniques.      Patient will benefit from skilled therapeutic intervention in order to improve the following deficits and impairments:  Increased edema, Impaired flexibility, Pain, Decreased coordination, Decreased scar mobility, Decreased activity tolerance, Decreased endurance, Decreased range of motion, Decreased strength, Decreased knowledge of precautions, Impaired UE functional use  Visit Diagnosis: Pain in right finger(s)  Stiffness of joint  Localized edema  Other lack of coordination    Problem List There are no active problems to display for this patient.   Tommy Fernandez, OT Student [phone] 02/11/2017, 2:19 PM  [place] [facility] [address] [place], [place], [address] Phone: [phone]   Fax:  [phone]  Name: Tommy Fernandez MRN: [id number] Date of Birth: [birth year]   Note reviewed by clinical instructor and accurately reflects treatment session.   Tommy Fernandez, OTR/L,CBIS  [phone]

## 2017-02-13 ENCOUNTER — Ambulatory Visit (HOSPITAL_COMMUNITY): Payer: Medicaid Other | Admitting: Occupational Therapy

## 2017-02-13 ENCOUNTER — Encounter (HOSPITAL_COMMUNITY): Payer: Self-pay | Admitting: Occupational Therapy

## 2017-02-13 DIAGNOSIS — M6281 Muscle weakness (generalized): Secondary | ICD-10-CM

## 2017-02-13 DIAGNOSIS — R278 Other lack of coordination: Secondary | ICD-10-CM

## 2017-02-13 DIAGNOSIS — M79644 Pain in right finger(s): Secondary | ICD-10-CM

## 2017-02-13 DIAGNOSIS — R600 Localized edema: Secondary | ICD-10-CM

## 2017-02-13 DIAGNOSIS — M2560 Stiffness of unspecified joint, not elsewhere classified: Secondary | ICD-10-CM

## 2017-02-13 NOTE — Therapy (Signed)
[place] [facility] [address] [place], [place], [address] Phone: [phone]   Fax:  [phone]  Occupational Therapy Treatment  Patient Details  Name: Tommy Fernandez MRN: [id number] Date of Birth: 07/14/1995 Referring Provider: Dr Bradly BienenstockFred Ortmann  Encounter Date: 02/13/2017      OT End of Session - 02/13/17 1201    Visit Number 7   Number of Visits 12   Date for OT Re-Evaluation 03/04/17   Authorization Type Medicaid approved 12 visits   Authorization Time Period 6/26-9/17/18   Authorization - Visit Number 7   Authorization - Number of Visits 12   OT Start Time 1127  pt arrived late   OT Stop Time 1200   OT Time Calculation (min) 33 min   Activity Tolerance Patient tolerated treatment well   Behavior [facility] Therapy [facility] tasks assessed/performed      Past Medical History:  Diagnosis Date  . Anxiety   . Asthma    as a child   . Bipolar and related disorder (HCC)   . Bradycardia 2015  . Constipation   . Depression   . Dyspnea    sometimes just standing up no exertion  . Family history of adverse reaction to anesthesia    takes more medication to get her to sleep  . GERD (gastroesophageal reflux disease)   . Syncope 2015   10/21/16- last time approx 1 year ago    Past Surgical History:  Procedure Laterality Date  . CLOSED REDUCTION FINGER WITH PERCUTANEOUS PINNING Right 10/22/2016   Procedure: RIGHT RING FINGER CLOSED REDUCTION AND PINNING, POSSIBLE OPEN REDUCTION EXTERNAL FIXATION (ORIF) FINGER;  Surgeon: Bradly BienenstockFred Ortmann, MD;  Location: [facility] OR;  Service: Orthopedics;  Laterality: Right;    There were no vitals filed for this visit.      Subjective Assessment - 02/13/17 1127    Subjective  S: It gets sore when I'm using this hand and finger.    Currently in Pain? No/denies            [facility] - 02/13/17 1127      Assessment   Diagnosis R RF middle phalanx fracture w. PIP joint dislocation requiring  internal fixation     Precautions   Precautions Other (comment)   Precaution Comments cleared for A/ROM, P/ROM to IP and MP joints, MD does not expect a normal arc of motion at PIP joint due to severity of injury, so greater emphasis to be placed on DIP and MP ROM, splint for comfort                  OT Treatments/Exercises (OP) - 02/13/17 1129      Exercises   Exercises Hand     Hand Exercises   Joint Blocking Exercises P/ROM and A/ROM joint blocking for PIP and DIP flexion, 15X each   Theraputty Flatten   Theraputty - Flatten red-pt completed pulling putty towards body with whole hand 10X, then completed pullinh putty towards body with just ring finger 10X.   Hand Gripper with Large Beads all beads with 61#   Hand Gripper with Medium Beads all beads with 61#   Hand Gripper with Small Beads all beads with 55#   Sponges Pt used green clothespin and 2 point pinch with 3rd adn 4th digits to grasp 25 sponges and place in bucket   Other Hand Exercises Pt stacked 6 sponges with thumb and ring finger. 2 trials.  OT Short Term Goals - 02/05/17 1157      OT SHORT TERM GOAL #1   Title Pt will be modified independent with splinting use, care and precautions right ring finger (Due: 01/21/17)   Time 6   Period Weeks   Status --     OT SHORT TERM GOAL #2   Title Pt will be modified independent with edema control techniques rt ring finger.   Time 6   Period Weeks   Status --     OT SHORT TERM GOAL #3   Title Pt will be modified independent with scar management rt ring finger.   Time 6   Period Weeks   Status --     OT SHORT TERM GOAL #4   Title Patient will increase right pinch strength by 5# to increase ability to complete fine motor tasks using right hand as dominant.   Time 4   Period Weeks   Status On-going     OT SHORT TERM GOAL #5   Title Patient will increase right grip strength by 15# to increase ability to hold onto items such as cell  phone easier in order to complete ADL tasks.   Time 4   Period Weeks   Status On-going           OT Long Term Goals - 02/05/17 1158      OT LONG TERM GOAL #1   Title Pt will be indepent with HEP once cleared by physician, for rt ring finger (Due 03/04/17)   Time 12   Period Weeks     OT LONG TERM GOAL #2   Title Pt will demonstrate increased grip strength in rt hand by 25# for daily and work related activities.   Time 12   Period Weeks   Status On-going     OT LONG TERM GOAL #3   Title Pt will demonstrate functional A/ROM in PIP and DIP joints of the right ring finger to assist in ADL tasks.   Time 12   Period Weeks   Status On-going     OT LONG TERM GOAL #4   Title Patient will increase right 3-point pinch strength by 10# to assist with in-hand manipulation tasks.   Time 12   Period Weeks   Status On-going               Plan - 02/13/17 1202    Clinical Impression Statement A: Continued with A/ROM for PIP and DIP joints of right 4th digit. Pt worked on hand and digit strengthening with gripper and resistive clothespins, decreased gripper with small beads due to max difficulty and increased pain. Pt notes he is able to see improvements in PIP flexion now. Pt with no redness or soreness in left hand today.    Plan P: continue with ROM and strengthening exercises focusing on PIP flexion/extension   OT Home Exercise Plan 01/28/17: Theraputty exercises   Consulted and Agree with Plan of Care Patient      Patient will benefit from skilled therapeutic intervention in order to improve the following deficits and impairments:  Increased edema, Impaired flexibility, Pain, Decreased coordination, Decreased scar mobility, Decreased activity tolerance, Decreased endurance, Decreased range of motion, Decreased strength, Decreased knowledge of precautions, Impaired UE functional use  Visit Diagnosis: Pain in right finger(s)  Stiffness of joint  Localized edema  Other lack  of coordination  Muscle weakness (generalized)    Problem List There are no active problems to display for this patient.  Tommy Fernandez, Tommy Fernandez  [phone] 02/13/2017, 12:05 PM  [place] [facility] [address] [place], [place], [address] Phone: [phone]   Fax:  [phone]  Name: Tommy Fernandez MRN: [id number] Date of Birth: Jun 24, 1996

## 2017-02-16 ENCOUNTER — Ambulatory Visit (HOSPITAL_COMMUNITY): Payer: Medicaid Other

## 2017-02-16 ENCOUNTER — Telehealth (HOSPITAL_COMMUNITY): Payer: Self-pay

## 2017-02-16 NOTE — Telephone Encounter (Signed)
8/13: Called patient regarding no show. Patient had mixed up appointment days and was unaware that he had missed. Rescheduled his missed appointment for this Wednesday 8/15 at 2:30PM.   Limmie PatriciaLaura Lunah Losasso, OTR/L,CBIS  [phone]

## 2017-02-18 ENCOUNTER — Ambulatory Visit (HOSPITAL_COMMUNITY): Payer: Medicaid Other | Admitting: Occupational Therapy

## 2017-02-18 ENCOUNTER — Encounter (HOSPITAL_COMMUNITY): Payer: Self-pay | Admitting: Occupational Therapy

## 2017-02-18 DIAGNOSIS — M79644 Pain in right finger(s): Secondary | ICD-10-CM | POA: Diagnosis not present

## 2017-02-18 DIAGNOSIS — M6281 Muscle weakness (generalized): Secondary | ICD-10-CM

## 2017-02-18 DIAGNOSIS — M2560 Stiffness of unspecified joint, not elsewhere classified: Secondary | ICD-10-CM

## 2017-02-18 NOTE — Therapy (Signed)
[place] [facility] [address] [place], [place], [address] Phone: [phone]   Fax:  [phone]  Occupational Therapy Treatment  Patient Details  Name: Tommy MarkerBrian L Kneeland MRN: [id number] Date of Birth: 12/02/1995 Referring Provider: Dr Bradly BienenstockFred Ortmann  Encounter Date: 02/18/2017      OT End of Session - 02/18/17 1618    Visit Number 8   Number of Visits 12   Date for OT Re-Evaluation 03/04/17   Authorization Type Medicaid approved 12 visits   Authorization Time Period 6/26-9/17/18   Authorization - Visit Number 8   Authorization - Number of Visits 12   OT Start Time 1434   OT Stop Time 1512   OT Time Calculation (min) 38 min   Activity Tolerance Patient tolerated treatment well   Behavior [facility] tasks assessed/performed      Past Medical History:  Diagnosis Date  . Anxiety   . Asthma    as a child   . Bipolar and related disorder (HCC)   . Bradycardia 2015  . Constipation   . Depression   . Dyspnea    sometimes just standing up no exertion  . Family history of adverse reaction to anesthesia    takes more medication to get her to sleep  . GERD (gastroesophageal reflux disease)   . Syncope 2015   10/21/16- last time approx 1 year ago    Past Surgical History:  Procedure Laterality Date  . CLOSED REDUCTION FINGER WITH PERCUTANEOUS PINNING Right 10/22/2016   Procedure: RIGHT RING FINGER CLOSED REDUCTION AND PINNING, POSSIBLE OPEN REDUCTION EXTERNAL FIXATION (ORIF) FINGER;  Surgeon: Bradly BienenstockFred Ortmann, MD;  Location: [facility] OR;  Service: Orthopedics;  Laterality: Right;    There were no vitals filed for this visit.      Subjective Assessment - 02/18/17 1434    Subjective  S: I feel like I'm getting more movement in this finger.    Currently in Pain? No/denies            [facility] - 02/18/17 1431      Assessment   Diagnosis R RF middle phalanx fracture w. PIP joint dislocation requiring internal fixation      Precautions   Precautions Other (comment)   Precaution Comments cleared for A/ROM, P/ROM to IP and MP joints, MD does not expect a normal arc of motion at PIP joint due to severity of injury, so greater emphasis to be placed on DIP and MP ROM, splint for comfort                  OT Treatments/Exercises (OP) - 02/18/17 1436      Exercises   Exercises Hand     Hand Exercises   MCPJ Flexion AROM;15 reps   Joint Blocking Exercises P/ROM and A/ROM joint blocking for MP, PIP, and DIP flexion, 15X each   Theraputty Flatten;Pinch   Theraputty - Flatten red   Theraputty - Pinch red-pinching with thumb and ring finger; then pinching with thumb and all digts in extension   Hand Gripper with Small Beads all beads with 55#   Sponges Pt used green clothespin and 2 point pinch with 3rd and 4th digits to grasp 25 sponges and place in bucket. Verbal cuing to keep elbow and shoulder down. Pt then used 4th and 5th digits and 2 point pinch with red clothespin to stack 5 sponges, 2 trials.    Other Hand Exercises Velcro board-pt rolled small roller up and  down narrow strip using 3rd and 4th digits focusing on achieving maximal flexion and extension of right MCP joints     Fine Motor Coordination   Fine Motor Coordination Grooved pegs   Manipulating coins Pt slid coins to edge of table using right ring finger, working on extension. Then picked up with thumb and ring finger and dropped in slotted container. Min difficulty   Grooved pegs Pt completed grooved pegboard, using right thumb and ring finger to grasp and place pegs. Verbal cuing to depress shoulder   Other Fine Motor Exercises Pt completed finger taps 10X with flat hand                  OT Short Term Goals - 02/05/17 1157      OT SHORT TERM GOAL #1   Title Pt will be modified independent with splinting use, care and precautions right ring finger (Due: 01/21/17)   Time 6   Period Weeks   Status --     OT SHORT TERM GOAL #2    Title Pt will be modified independent with edema control techniques rt ring finger.   Time 6   Period Weeks   Status --     OT SHORT TERM GOAL #3   Title Pt will be modified independent with scar management rt ring finger.   Time 6   Period Weeks   Status --     OT SHORT TERM GOAL #4   Title Patient will increase right pinch strength by 5# to increase ability to complete fine motor tasks using right hand as dominant.   Time 4   Period Weeks   Status On-going     OT SHORT TERM GOAL #5   Title Patient will increase right grip strength by 15# to increase ability to hold onto items such as cell phone easier in order to complete ADL tasks.   Time 4   Period Weeks   Status On-going           OT Long Term Goals - 02/05/17 1158      OT LONG TERM GOAL #1   Title Pt will be indepent with HEP once cleared by physician, for rt ring finger (Due 03/04/17)   Time 12   Period Weeks     OT LONG TERM GOAL #2   Title Pt will demonstrate increased grip strength in rt hand by 25# for daily and work related activities.   Time 12   Period Weeks   Status On-going     OT LONG TERM GOAL #3   Title Pt will demonstrate functional A/ROM in PIP and DIP joints of the right ring finger to assist in ADL tasks.   Time 12   Period Weeks   Status On-going     OT LONG TERM GOAL #4   Title Patient will increase right 3-point pinch strength by 10# to assist with in-hand manipulation tasks.   Time 12   Period Weeks   Status On-going               Plan - 02/18/17 1619    Clinical Impression Statement A: Continued with A/ROM for MCP joints and hand/pinch strengthening. Added grooved pegboard working on IP flexion, as well as IP extension activities. Pt reports fatigue at end of session, OT now able to palpate slight motion at PIP joint.    Plan P: Continue with velcro board activity working on IP flexion/extension. Place objects in putty to remove with thumb  and 4th digit   OT Home Exercise  Plan 01/28/17: Theraputty exercises   Consulted and Agree with Plan of Care Patient      Patient will benefit from skilled therapeutic intervention in order to improve the following deficits and impairments:  Increased edema, Impaired flexibility, Pain, Decreased coordination, Decreased scar mobility, Decreased activity tolerance, Decreased endurance, Decreased range of motion, Decreased strength, Decreased knowledge of precautions, Impaired UE functional use  Visit Diagnosis: Pain in right finger(s)  Stiffness of joint  Muscle weakness (generalized)    Problem List There are no active problems to display for this patient.  Ezra Sites, OTR/L  [phone] 02/18/2017, 4:24 PM  [facility] [address] [place], [place], [address] Phone: [phone]   Fax:  [phone]  Name: VALERIAN JEWEL MRN: [id number] Date of Birth: 14-Nov-1995

## 2017-02-19 ENCOUNTER — Ambulatory Visit (HOSPITAL_COMMUNITY): Payer: Medicaid Other | Admitting: Occupational Therapy

## 2017-02-19 ENCOUNTER — Encounter (HOSPITAL_COMMUNITY): Payer: Self-pay | Admitting: Occupational Therapy

## 2017-02-19 DIAGNOSIS — M79644 Pain in right finger(s): Secondary | ICD-10-CM

## 2017-02-19 DIAGNOSIS — M2560 Stiffness of unspecified joint, not elsewhere classified: Secondary | ICD-10-CM

## 2017-02-19 DIAGNOSIS — M6281 Muscle weakness (generalized): Secondary | ICD-10-CM

## 2017-02-19 NOTE — Therapy (Signed)
[facility] [address] [place], [place], [address] Phone: [phone]   Fax:  [phone]  Occupational Therapy Treatment  Patient Details  Name: Tommy Fernandez MRN: [id number] Date of Birth: Apr 30, 1996 Referring Provider: Dr Bradly Bienenstock  Encounter Date: 02/19/2017      OT End of Session - 02/19/17 1344    Visit Number 9   Number of Visits 12   Date for OT Re-Evaluation 03/04/17   Authorization Type Medicaid approved 12 visits   Authorization Time Period 6/26-9/17/18   Authorization - Visit Number 9   Authorization - Number of Visits 12   OT Start Time 1300   OT Stop Time 1339   OT Time Calculation (min) 39 min   Activity Tolerance Patient tolerated treatment well   Behavior During Therapy [facility] for tasks assessed/performed      Past Medical History:  Diagnosis Date  . Anxiety   . Asthma    as a child   . Bipolar and related disorder (HCC)   . Bradycardia 2015  . Constipation   . Depression   . Dyspnea    sometimes just standing up no exertion  . Family history of adverse reaction to anesthesia    takes more medication to get her to sleep  . GERD (gastroesophageal reflux disease)   . Syncope 2015   10/21/16- last time approx 1 year ago    Past Surgical History:  Procedure Laterality Date  . CLOSED REDUCTION FINGER WITH PERCUTANEOUS PINNING Right 10/22/2016   Procedure: RIGHT RING FINGER CLOSED REDUCTION AND PINNING, POSSIBLE OPEN REDUCTION EXTERNAL FIXATION (ORIF) FINGER;  Surgeon: Bradly Bienenstock, MD;  Location: [facility] OR;  Service: Orthopedics;  Laterality: Right;    There were no vitals filed for this visit.      Subjective Assessment - 02/19/17 1253    Subjective  S: I've been using this hand to do most things at home.    Currently in Pain? No/denies            [facility] OT Assessment - 02/19/17 1253      Assessment   Diagnosis R RF middle phalanx fracture w. PIP joint dislocation requiring internal fixation      Precautions   Precautions Other (comment)   Precaution Comments cleared for A/ROM, P/ROM to IP and MP joints, MD does not expect a normal arc of motion at PIP joint due to severity of injury, so greater emphasis to be placed on DIP and MP ROM, splint for comfort                  OT Treatments/Exercises (OP) - 02/19/17 1302      Exercises   Exercises Hand     Hand Exercises   MCPJ Flexion AROM;15 reps   Theraputty Pinch   Theraputty - Grip red-pt used pvc pipe to cut circles into putty, working on flexion of 4th digit   Theraputty - Locate Pegs --   Hand Gripper with Medium Beads all beads with 55#   Hand Gripper with Small Beads all beads with 55#  1 extended rest break   Other Hand Exercises Velcro board-pt rolled small roller up and down wide strip using 3rd and 4th digits focusing on achieving maximal flexion and extension of right MCP joints   Other Hand Exercises Composite finger flexion and extension A/ROM 15X.      Fine Motor Coordination   Fine Motor Coordination Grooved pegs   Grooved pegs Pt completed grooved  pegboard, using right thumb and ring finger to grasp and place pegs using tweezers. Pt able to place pegs with just thumb and 4th digit, occasionally adding 3rd digit for stability. Pt removed using only thumb and 4th digit, no tweezers.    Other Fine Motor Exercises Pt placed green, blue, and black clothespins along horizontal pinch tree bars, using only thumb 3rd, and 4th digits. Pt then removed green and blue with just thumb and 4th digit-mod difficulty; added 3rd digit to remove black pins.    Other Fine Motor Exercises Pt completed finger taps 10X with flat hand                  OT Short Term Goals - 02/05/17 1157      OT SHORT TERM GOAL #1   Title Pt will be modified independent with splinting use, care and precautions right ring finger (Due: 01/21/17)   Time 6   Period Weeks   Status --     OT SHORT TERM GOAL #2   Title Pt will be  modified independent with edema control techniques rt ring finger.   Time 6   Period Weeks   Status --     OT SHORT TERM GOAL #3   Title Pt will be modified independent with scar management rt ring finger.   Time 6   Period Weeks   Status --     OT SHORT TERM GOAL #4   Title Patient will increase right pinch strength by 5# to increase ability to complete fine motor tasks using right hand as dominant.   Time 4   Period Weeks   Status On-going     OT SHORT TERM GOAL #5   Title Patient will increase right grip strength by 15# to increase ability to hold onto items such as cell phone easier in order to complete ADL tasks.   Time 4   Period Weeks   Status On-going           OT Long Term Goals - 02/05/17 1158      OT LONG TERM GOAL #1   Title Pt will be indepent with HEP once cleared by physician, for rt ring finger (Due 03/04/17)   Time 12   Period Weeks     OT LONG TERM GOAL #2   Title Pt will demonstrate increased grip strength in rt hand by 25# for daily and work related activities.   Time 12   Period Weeks   Status On-going     OT LONG TERM GOAL #3   Title Pt will demonstrate functional A/ROM in PIP and DIP joints of the right ring finger to assist in ADL tasks.   Time 12   Period Weeks   Status On-going     OT LONG TERM GOAL #4   Title Patient will increase right 3-point pinch strength by 10# to assist with in-hand manipulation tasks.   Time 12   Period Weeks   Status On-going               Plan - 02/19/17 1344    Clinical Impression Statement A: continued with A/ROM for MCP joints, hand/grip strengthening, and pinch strengthening. Added tweezers to grooved pegboard which pt completed with min difficulty. Added pvc activity with theraputty, pt reports fatigue at PIP joint of 4th digit at end of task.    Plan P: Continue with ROM and strengthening activities, focusing on [facility] and DIP flexion/extension   OT Home Exercise Plan  01/28/17: Theraputty exercises    Consulted and Agree with Plan of Care Patient      Patient will benefit from skilled therapeutic intervention in order to improve the following deficits and impairments:  Increased edema, Impaired flexibility, Pain, Decreased coordination, Decreased scar mobility, Decreased activity tolerance, Decreased endurance, Decreased range of motion, Decreased strength, Decreased knowledge of precautions, Impaired UE functional use  Visit Diagnosis: Pain in right finger(s)  Stiffness of joint  Muscle weakness (generalized)    Problem List There are no active problems to display for this patient.  Ezra SitesLeslie Jael Kostick, OTR/L  [phone] 02/19/2017, 1:47 PM   [facility] [address] [place], [place], [address] Phone: [phone]   Fax:  [phone]  Name: Tommy Fernandez MRN: [id number] Date of Birth: 04/19/1996

## 2017-02-23 ENCOUNTER — Telehealth (HOSPITAL_COMMUNITY): Payer: Self-pay | Admitting: General Practice

## 2017-02-23 ENCOUNTER — Ambulatory Visit (HOSPITAL_COMMUNITY): Payer: Medicaid Other

## 2017-02-23 NOTE — Telephone Encounter (Signed)
02/23/17  Pt wanted to come in on another day but the times I offered he didn't want.  He said to just cx today's appt.

## 2017-02-26 ENCOUNTER — Encounter (HOSPITAL_COMMUNITY): Payer: Self-pay

## 2017-02-26 ENCOUNTER — Ambulatory Visit (HOSPITAL_COMMUNITY): Payer: Medicaid Other

## 2017-02-26 DIAGNOSIS — M79644 Pain in right finger(s): Secondary | ICD-10-CM

## 2017-02-26 DIAGNOSIS — M6281 Muscle weakness (generalized): Secondary | ICD-10-CM

## 2017-02-26 DIAGNOSIS — M2560 Stiffness of unspecified joint, not elsewhere classified: Secondary | ICD-10-CM

## 2017-02-26 DIAGNOSIS — R600 Localized edema: Secondary | ICD-10-CM

## 2017-02-26 DIAGNOSIS — R278 Other lack of coordination: Secondary | ICD-10-CM

## 2017-02-26 NOTE — Therapy (Signed)
[facility] [address] [place], [place], [address] Phone: [phone]   Fax:  [phone]  Occupational Therapy Treatment  Patient Details  Name: Tommy Fernandez MRN: [id number] Date of Birth: 1996/01/25 Referring Provider: Dr Bradly Bienenstock  Encounter Date: 02/26/2017      OT End of Session - 02/26/17 1634    Visit Number 10   Number of Visits 12   Date for OT Re-Evaluation 03/04/17   Authorization Type Medicaid approved 12 visits   Authorization Time Period 6/26-9/17/18   Authorization - Visit Number 10   Authorization - Number of Visits 12   OT Start Time 1601   OT Stop Time 1645   OT Time Calculation (min) 44 min   Activity Tolerance Patient tolerated treatment well   Behavior During Therapy [facility] for tasks assessed/performed      Past Medical History:  Diagnosis Date  . Anxiety   . Asthma    as a child   . Bipolar and related disorder (HCC)   . Bradycardia 2015  . Constipation   . Depression   . Dyspnea    sometimes just standing up no exertion  . Family history of adverse reaction to anesthesia    takes more medication to get her to sleep  . GERD (gastroesophageal reflux disease)   . Syncope 2015   10/21/16- last time approx 1 year ago    Past Surgical History:  Procedure Laterality Date  . CLOSED REDUCTION FINGER WITH PERCUTANEOUS PINNING Right 10/22/2016   Procedure: RIGHT RING FINGER CLOSED REDUCTION AND PINNING, POSSIBLE OPEN REDUCTION EXTERNAL FIXATION (ORIF) FINGER;  Surgeon: Bradly Bienenstock, MD;  Location: [facility] OR;  Service: Orthopedics;  Laterality: Right;    There were no vitals filed for this visit.      Subjective Assessment - 02/26/17 1628    Subjective  S: At first, I wasn't able to notice any movement but now I think there is a little bit.    Currently in Pain? No/denies            [facility] OT Assessment - 02/26/17 1628      Assessment   Diagnosis R RF middle phalanx fracture w. PIP joint  dislocation requiring internal fixation     Precautions   Precautions Other (comment)   Precaution Comments cleared for A/ROM, P/ROM to IP and MP joints, MD does not expect a normal arc of motion at PIP joint due to severity of injury, so greater emphasis to be placed on DIP and MP ROM, splint for comfort                  OT Treatments/Exercises (OP) - 02/26/17 1629      Exercises   Exercises Hand     Hand Exercises   MCPJ Flexion AROM;15 reps   Theraputty Grip;Roll;Flatten   Theraputty - Flatten red   Theraputty - Roll red   Theraputty - Grip red- supinated and pronated   Theraputty - Pinch red- curved 3 point (middle and ring) and then finger pad 3 point   Theraputty - Locate Pegs red- Patient used ring finger to roll beads forward (extension) or backward (flexion)    Hand Gripper with Large Beads all beads with gripper set at 55#   Hand Gripper with Medium Beads all beads with 55#   Hand Gripper with Small Beads all beads with 55#   Other Hand Exercises Pt utilized green resistive clothespin a 3 point pinch (middle and ring)  to pick up 30 sponges and place in container.                   OT Short Term Goals - 02/05/17 1157      OT SHORT TERM GOAL #1   Title Pt will be modified independent with splinting use, care and precautions right ring finger (Due: 01/21/17)   Time 6   Period Weeks   Status --     OT SHORT TERM GOAL #2   Title Pt will be modified independent with edema control techniques rt ring finger.   Time 6   Period Weeks   Status --     OT SHORT TERM GOAL #3   Title Pt will be modified independent with scar management rt ring finger.   Time 6   Period Weeks   Status --     OT SHORT TERM GOAL #4   Title Patient will increase right pinch strength by 5# to increase ability to complete fine motor tasks using right hand as dominant.   Time 4   Period Weeks   Status On-going     OT SHORT TERM GOAL #5   Title Patient will increase right  grip strength by 15# to increase ability to hold onto items such as cell phone easier in order to complete ADL tasks.   Time 4   Period Weeks   Status On-going           OT Long Term Goals - 02/05/17 1158      OT LONG TERM GOAL #1   Title Pt will be indepent with HEP once cleared by physician, for rt ring finger (Due 03/04/17)   Time 12   Period Weeks     OT LONG TERM GOAL #2   Title Pt will demonstrate increased grip strength in rt hand by 25# for daily and work related activities.   Time 12   Period Weeks   Status On-going     OT LONG TERM GOAL #3   Title Pt will demonstrate functional A/ROM in PIP and DIP joints of the right ring finger to assist in ADL tasks.   Time 12   Period Weeks   Status On-going     OT LONG TERM GOAL #4   Title Patient will increase right 3-point pinch strength by 10# to assist with in-hand manipulation tasks.   Time 12   Period Weeks   Status On-going               Plan - 02/26/17 1637    Clinical Impression Statement A: Pt has very little movement in right ring PIP joint and does not tolerate aggressive stretching. Pt reports it feel like something is stopping it. Session focused on grip and pinch strength as well as middle finger strength.    Plan P:Continue to work on ROM and strengthening activities focusing on [facility] and DIP flexion/extension.       Patient will benefit from skilled therapeutic intervention in order to improve the following deficits and impairments:  Increased edema, Impaired flexibility, Pain, Decreased coordination, Decreased scar mobility, Decreased activity tolerance, Decreased endurance, Decreased range of motion, Decreased strength, Decreased knowledge of precautions, Impaired UE functional use  Visit Diagnosis: Pain in right finger(s)  Stiffness of joint  Localized edema  Muscle weakness (generalized)  Other lack of coordination    Problem List There are no active problems to display for this  patient.  Limmie Patricia, OTR/L,CBIS  [phone]  02/26/2017,  4:41 PM  [facility] [address] [place], [place], [address] Phone: [phone]   Fax:  [phone]  Name: Tommy Fernandez MRN: [id number] Date of Birth: 1996-01-29

## 2017-03-02 ENCOUNTER — Ambulatory Visit (HOSPITAL_COMMUNITY): Payer: Medicaid Other | Admitting: Specialist

## 2017-03-04 ENCOUNTER — Ambulatory Visit (HOSPITAL_COMMUNITY): Payer: Medicaid Other | Admitting: Occupational Therapy

## 2017-03-04 DIAGNOSIS — M79644 Pain in right finger(s): Secondary | ICD-10-CM

## 2017-03-04 DIAGNOSIS — M2560 Stiffness of unspecified joint, not elsewhere classified: Secondary | ICD-10-CM

## 2017-03-04 DIAGNOSIS — M6281 Muscle weakness (generalized): Secondary | ICD-10-CM

## 2017-03-04 NOTE — Therapy (Addendum)
[facility] [place], [place], [address] Phone: [phone]   Fax:  [phone]  Occupational Therapy Reassessment and Treatment (recertification)  Patient Details  Name: Tommy Fernandez MRN: [id number] Date of Birth: 11/12/1995 Referring Provider: Dr Iran Planas  Encounter Date: 03/04/2017      OT End of Session - 03/04/17 1523    Visit Number 11   Number of Visits 15   Date for OT Re-Evaluation 04/03/17   Authorization Type Medicaid approved 12 visits   Authorization Time Period 6/26-9/17/18; requesting 3 additional visits   Authorization - Visit Number 11   Authorization - Number of Visits 12   OT Start Time 6962  pt arrived late   OT Stop Time 1428   OT Time Calculation (min) 35 min   Activity Tolerance Patient tolerated treatment well   Behavior During Therapy [facility] for tasks assessed/performed      Past Medical History:  Diagnosis Date  . Anxiety   . Asthma    as a child   . Bipolar and related disorder ([place])   . Bradycardia 2015  . Constipation   . Depression   . Dyspnea    sometimes just standing up no exertion  . Family history of adverse reaction to anesthesia    takes more medication to get her to sleep  . GERD (gastroesophageal reflux disease)   . Syncope 2015   10/21/16- last time approx 1 year ago    Past Surgical History:  Procedure Laterality Date  . CLOSED REDUCTION FINGER WITH PERCUTANEOUS PINNING Right 10/22/2016   Procedure: RIGHT RING FINGER CLOSED REDUCTION AND PINNING, POSSIBLE OPEN REDUCTION EXTERNAL FIXATION (ORIF) FINGER;  Surgeon: Iran Planas, MD;  Location: [facility];  Service: Orthopedics;  Laterality: Right;    There were no vitals filed for this visit.      Subjective Assessment - 03/04/17 1521    Subjective  S: I really want to get this finger back where I can use it.    Currently in Pain? No/denies            [facility] OT Assessment - 03/04/17 1355      Assessment    Diagnosis R RF middle phalanx fracture w. PIP joint dislocation requiring internal fixation     Precautions   Precautions Other (comment)   Precaution Comments cleared for A/ROM, P/ROM to IP and MP joints, MD does not expect a normal arc of motion at PIP joint due to severity of injury, so greater emphasis to be placed on DIP and MP ROM, splint for comfort     Strength   Right Hand Grip (lbs) 73  60 previous   Right Hand Lateral Pinch 20 lbs  18   Right Hand 3 Point Pinch 14 lbs  6     Right Hand AROM   R Ring PIP 0-100 38 Degrees  36   R Ring DIP 0-70 65 Degrees  50     Right Hand PROM   R Ring PIP 0-100 48 Degrees  42 previous   R Ring DIP 0-70 70 Degrees  62 previous                  OT Treatments/Exercises (OP) - 03/04/17 1413      ADLs   ADL Comments Educated pt on tasks to practice with right hand to promote improvements in functional use. Engaged pt in problem-solving for work related interets and household tasks.  Exercises   Exercises Hand     Hand Exercises   Hand Gripper with Large Beads all beads with gripper set at 61#   Hand Gripper with Medium Beads all beads with 61#   Hand Gripper with Small Beads all beads with 55#                  OT Short Term Goals - 03/04/17 1404      OT SHORT TERM GOAL #1   Title Pt will be modified independent with splinting use, care and precautions right ring finger (Due: 01/21/17)   Time 6   Period Weeks     OT SHORT TERM GOAL #2   Title Pt will be modified independent with edema control techniques rt ring finger.   Time 6   Period Weeks     OT SHORT TERM GOAL #3   Title Pt will be modified independent with scar management rt ring finger.   Time 6   Period Weeks     OT SHORT TERM GOAL #4   Title Patient will increase right pinch strength by 5# to increase ability to complete fine motor tasks using right hand as dominant.   Time 4   Period Weeks   Status Achieved     OT SHORT TERM GOAL  #5   Title Patient will increase right grip strength by 15# to increase ability to hold onto items such as cell phone easier in order to complete ADL tasks.   Time 4   Period Weeks   Status Achieved           OT Long Term Goals - 03/04/17 1404      OT LONG TERM GOAL #1   Title Pt will be indepent with HEP once cleared by physician, for rt ring finger (Due 03/04/17)   Time 12   Period Weeks     OT LONG TERM GOAL #2   Title Pt will demonstrate increased grip strength in rt hand by 25# for daily and work related activities.   Time 12   Period Weeks   Status On-going     OT LONG TERM GOAL #3   Title Pt will demonstrate functional A/ROM in PIP and DIP joints of the right ring finger to assist in ADL tasks.   Time 12   Period Weeks   Status On-going     OT LONG TERM GOAL #4   Title Patient will increase right 3-point pinch strength by 10# to assist with in-hand manipulation tasks.   Time 12   Period Weeks   Status On-going               Plan - 03/04/17 1523    Clinical Impression Statement A: Reassessment completed this session, pt has met all STGs and 1/4 LTGs. Pt continues to have limited ROM at PIP joint, DIP is now WNL. Pt also continues to be limited in grip and pinch strength, although has improved since initial evaluation. Discussed options with pt including continuing with therapy, discharging with HEP, and holding until MD sees pt. Pt would like to continue therapy 1x/week for an additional 4 weeks to begin the transition to HEP.    Current Impairments/barriers affecting progress: Good for pt age and motivational level; Fair due to extensive surgery/ingury to PIP joint right RF.   OT Frequency 1x / week   OT Duration 4 weeks   OT Treatment/Interventions Self-care/ADL training;Therapeutic exercise;Parrafin;Neuromuscular education;Splinting;Fluidtherapy;Scar mobilization;Therapeutic exercises;Patient/family education;Ultrasound;Manual Therapy;Passive range of  motion;Therapeutic  activities   Plan P: Continue therapy will focus on remaining goals: grip strength, pinch strength, and achieving any additional ROM in the PIP joint. Also begin looking at compensatory strategies for ADL and work task completion   Clinical Decision Making Several treatment options, min-mod task modification necessary   OT Home Exercise Plan 01/28/17: Theraputty exercises   Consulted and Agree with Plan of Care Patient      Patient will benefit from skilled therapeutic intervention in order to improve the following deficits and impairments:  Increased edema, Impaired flexibility, Pain, Decreased coordination, Decreased scar mobility, Decreased activity tolerance, Decreased endurance, Decreased range of motion, Decreased strength, Decreased knowledge of precautions, Impaired UE functional use  Visit Diagnosis: Pain in right finger(s)  Stiffness of joint  Muscle weakness (generalized)    Problem List There are no active problems to display for this patient.  Guadelupe Sabin, OTR/L  [phone] 03/04/2017, 3:28 PM  [facility] [address] [place], [place], [address] Phone: [phone]   Fax:  [phone]  Name: DUANTE AROCHO MRN: [id number] Date of Birth: 05-16-1996   OCCUPATIONAL THERAPY DISCHARGE SUMMARY(1.23.19)  Visits from Start of Care: 11  Current functional level related to goals / functional outcomes: Pt being discharge due to not returning since last visit on 03/04/17. Unknown what deficits remain and if any goals have been met.     Plan: Patient agrees to discharge.  Patient goals were not met. Patient is being discharged due to not returning since the last visit.  ?????         Ailene Ravel, OTR/L,CBIS  [phone]

## 2017-03-10 ENCOUNTER — Ambulatory Visit (HOSPITAL_COMMUNITY): Payer: No Typology Code available for payment source | Attending: Orthopedic Surgery

## 2018-10-28 IMAGING — DX DG FINGER RING 2+V*R*
3 series · 3 of 3 positions shown · non-contrast
Comparison: None.

CLINICAL DATA: Jammed right ring finger 3 weeks ago

EXAM:
RIGHT RING FINGER 2+V

[finger ap]
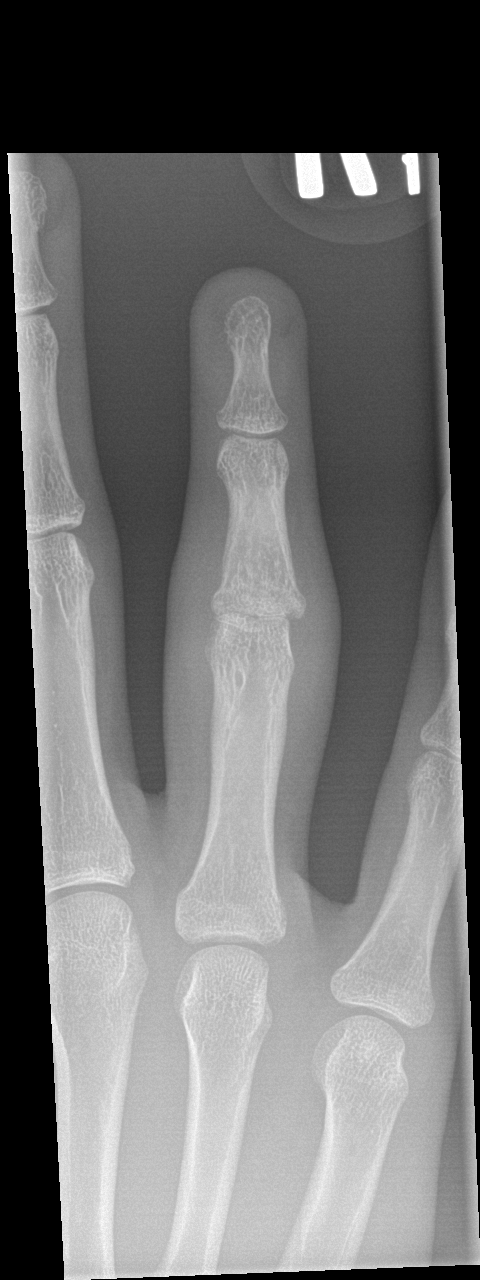

[finger obl]
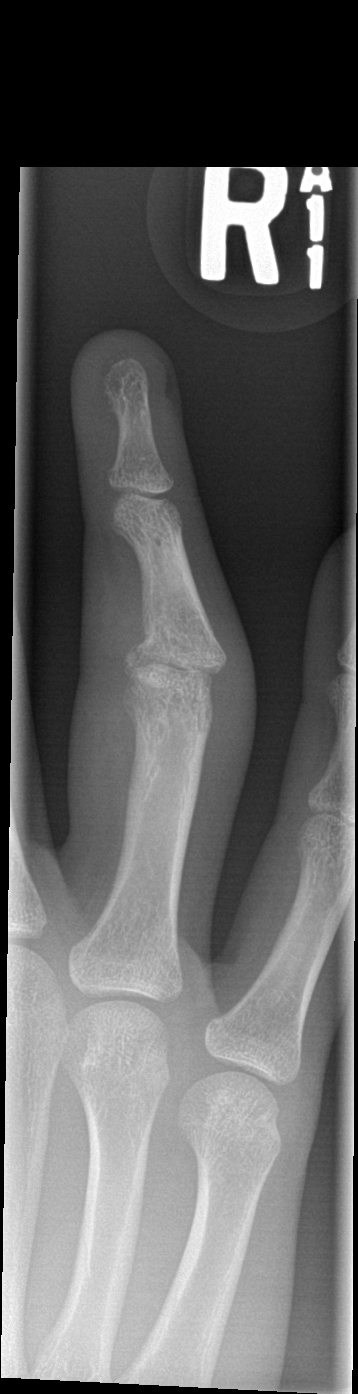

[finger lat]
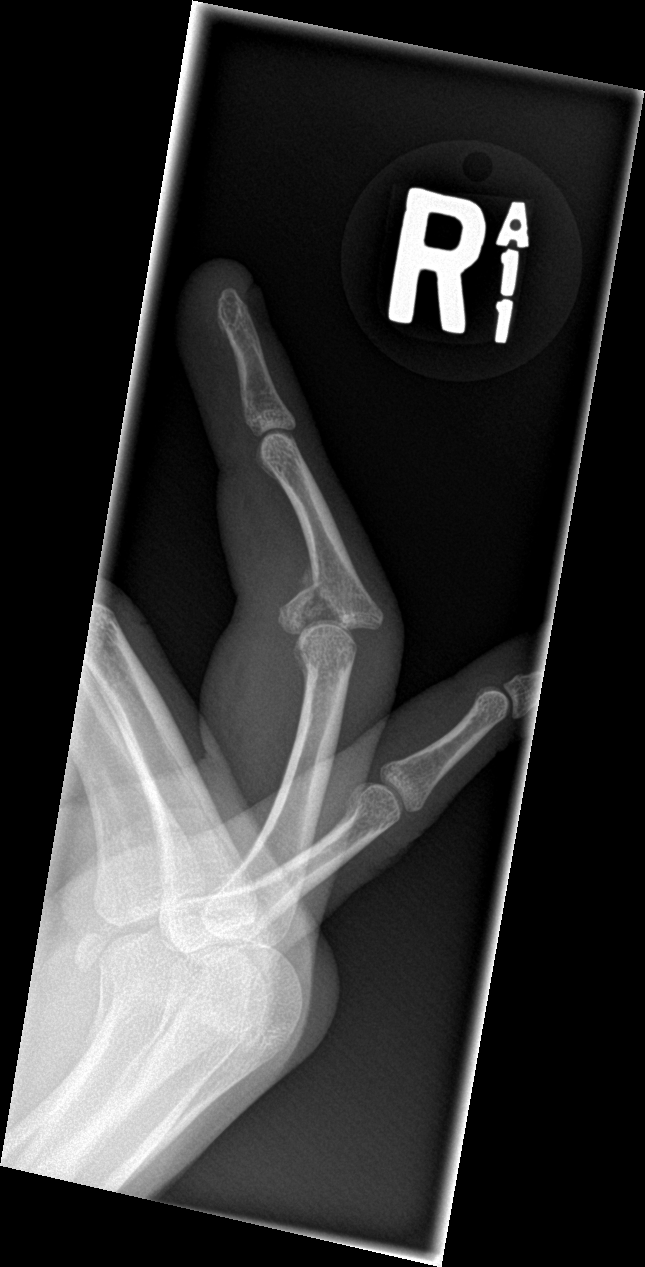

[3 of 3 positions shown; findings below may reference images not displayed]

FINDINGS: Three views of the right fourth finger submitted. There is mild
impacted avulsion volar fracture at the base of middle phalanx.
Avulsed bony fragment measures at least 7 mm. Mild dorsal
subluxation of middle phalanx from proximal interphalangeal joint
best seen on lateral view. Soft tissue swelling proximal and mid
finger.
IMPRESSION: There is mild impacted avulsion volar fracture at the base of middle
phalanx. Avulsed bony fragment measures at least 7 mm. Mild dorsal
subluxation of middle phalanx from proximal interphalangeal joint
best seen on lateral view.

## 2019-09-20 ENCOUNTER — Ambulatory Visit
Admission: EM | Admit: 2019-09-20 | Discharge: 2019-09-20 | Disposition: A | Discharge status: Home / Self Care | Payer: Self-pay | Attending: Physician Assistant | Admitting: Physician Assistant

## 2019-09-20 DIAGNOSIS — H18892 Other specified disorders of cornea, left eye: Secondary | ICD-10-CM

## 2019-09-20 MED ORDER — OFLOXACIN 0.3 % OP SOLN: 1.0000 [drp] | Freq: Four times a day (QID) | OPHTHALMIC | 0 refills | Status: AC

## 2019-09-20 MED ORDER — TETRACAINE HCL 0.5 % OP SOLN
1.0000 [drp] | Freq: Once | OPHTHALMIC | Status: AC
  Administered 2019-09-20: 1 [drp] via OPHTHALMIC

## 2019-09-20 MED ORDER — SYSTANE 0.4-0.3 % OP GEL: 1.0000 "application " | Freq: Every evening | OPHTHALMIC | 0 refills | Status: DC | PRN

## 2019-09-20 NOTE — ED Provider Notes (Signed)
[facility]    CSN: [id number] Arrival date & time: 09/20/19  1043      History   Chief Complaint Chief Complaint  Patient presents with  . Eye Pain    HPI Tommy Fernandez is a 24 y.o. male.   24 year old male comes in for left eye irritation after possible foreign body.  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  States washed left eye out with saline solution from eye station, with some relief, though still continues to feel foreign body sensation especially with blinking.  The eye was red after irrigation does slightly improved.  Denies photophobia, vision changes.  Has had tearing of the eyes.     Past Medical History:  Diagnosis Date  . Anxiety   . Asthma    as a child   . Bipolar and related disorder ([facility])   . Bradycardia 2015  . Constipation   . Depression   . Dyspnea    sometimes just standing up no exertion  . Family history of adverse reaction to anesthesia    takes more medication to get her to sleep  . GERD (gastroesophageal reflux disease)   . Syncope 2015   10/21/16- last time approx 1 year ago    There are no problems to display for this patient.   Past Surgical History:  Procedure Laterality Date  . CLOSED REDUCTION FINGER WITH PERCUTANEOUS PINNING Right 10/22/2016   Procedure: RIGHT RING FINGER CLOSED REDUCTION AND PINNING, POSSIBLE OPEN REDUCTION EXTERNAL FIXATION (ORIF) FINGER;  Surgeon: Bradly Bienenstock, MD;  Location: [facility];  Service: Orthopedics;  Laterality: Right;       Home Medications    Prior to Admission medications   Medication Sig Start Date End Date Taking? Authorizing Provider  ofloxacin (OCUFLOX) 0.3 % ophthalmic solution Place 1 drop into the left eye 4 (four) times daily for 7 days. 09/20/19 09/27/19  Belinda Fisher, PA-C  Polyethyl Glycol-Propyl Glycol (SYSTANE) 0.4-0.3 % GEL ophthalmic gel Place 1 application into both eyes at bedtime as  needed. 09/20/19   Belinda Fisher, PA-C    Family History History reviewed. No pertinent family history.  Social History Social History   Tobacco Use  . Smoking status: Former Smoker    Packs/day: 0.50    Years: 5.00    Pack years: 2.50    Types: Cigarettes  . Smokeless tobacco: Never Used  . Tobacco comment: Quit January 2018   Substance Use Topics  . Alcohol use: Yes    Comment: 1/2 fifth a week  . Drug use: Not Currently    Comment: LSD- none since 02/2015     Allergies   No known allergies   Review of Systems Review of Systems  Reason unable to perform ROS: See HPI as above.     Physical Exam Triage Vital Signs ED Triage Vitals [09/20/19 1054]  Enc Vitals Group     BP (!) 142/90     Pulse Rate 83     Resp 16     Temp 98.1 F (36.7 C)     Temp Source Oral     SpO2 98 %     Weight      Height      Head Circumference      Peak Flow      Pain Score 2     Pain Loc      Pain  Edu?      Excl. in [place]?    No data found.  Updated Vital Signs BP (!) 142/90 (BP Location: Left Arm)   Pulse 83   Temp 98.1 F (36.7 C) (Oral)   Resp 16   SpO2 98%   Visual Acuity Right Eye Distance: 20/25 Left Eye Distance: 20/25 Bilateral Distance: 20/25  Right Eye Near:   Left Eye Near:    Bilateral Near:     Physical Exam Constitutional:      General: He is not in acute distress.    Appearance: He is well-developed.  HENT:     Head: Normocephalic and atraumatic.  Eyes:     General: Lids are normal. Lids are everted, no foreign bodies appreciated.     Extraocular Movements: Extraocular movements intact.     Conjunctiva/sclera: Conjunctivae normal.     Pupils: Pupils are equal, round, and reactive to light.     Comments: No obvious foreign body, deformity seen.  No photophobia on exam.  Fluorescein stain without uptake.  Musculoskeletal:     Cervical back: Normal range of motion and neck supple.  Skin:    General: Skin is warm and dry.  Neurological:     Mental  Status: He is alert and oriented to person, place, and time.      UC Treatments / Results  Labs (all labs ordered are listed, but only abnormal results are displayed) Labs Reviewed - No data to display  EKG   Radiology No results found.  Procedures Procedures (including critical care time)  Medications Ordered in UC Medications  tetracaine (PONTOCAINE) 0.5 % ophthalmic solution 1 drop (1 drop Left Eye Given 09/20/19 1122)    Initial Impression / Assessment and Plan / UC Course  I have reviewed the triage vital signs and the nursing notes.  Pertinent labs & imaging results that were available during my care of the patient were reviewed by me and considered in my medical decision making (see chart for details).    Exam without corneal abrasion, foreign body.  Patient with relief of symptoms after tetracaine drop.  Discussed eye most likely irritated from irrigation/possible foreign body.  At this time will provide symptomatic treatment with artificial tears gels.  Discussed if develop bacterial conjunctivitis symptoms including eye discharge, redness, crusting, can pick up Rx of ofloxacin as directed.  Otherwise return precautions given.  Patient expresses understanding and agrees to plan.  Final Clinical Impressions(s) / UC Diagnoses   Final diagnoses:  Corneal irritation of left eye   ED Prescriptions    Medication Sig Dispense Auth. Provider   Polyethyl Glycol-Propyl Glycol (SYSTANE) 0.4-0.3 % GEL ophthalmic gel Place 1 application into both eyes at bedtime as needed. 8 mL Threasa Kinch V, PA-C   ofloxacin (OCUFLOX) 0.3 % ophthalmic solution Place 1 drop into the left eye 4 (four) times daily for 7 days. 1.4 mL Ok Edwards, PA-C     PDMP not reviewed this encounter.   Ok Edwards, PA-C 09/20/19 [phone]

## 2019-09-20 NOTE — Discharge Instructions (Signed)
No signs of foreign body, corneal injury on exam. At this time, systane gel as needed. Can put in fridge for further relief. If develop drainage, crusting, eye redness, start ofloxacin as directed. Monitor for any worsening of symptoms, changes in vision, sensitivity to light, eye swelling, painful eye movement, follow up with ophthalmology for further evaluation.

## 2019-09-20 NOTE — ED Triage Notes (Signed)
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 States washed out lt eye with saline solution. States still feels something in lt eye when blinking. Lt eye is red.

## 2022-08-08 ENCOUNTER — Other Ambulatory Visit: Payer: Self-pay

## 2022-08-08 ENCOUNTER — Encounter (HOSPITAL_COMMUNITY): Payer: Self-pay | Admitting: Emergency Medicine

## 2022-08-08 ENCOUNTER — Emergency Department (HOSPITAL_COMMUNITY)
Admission: EM | Admit: 2022-08-08 | Discharge: 2022-08-08 | Disposition: A | Discharge status: Home / Self Care | Payer: Medicaid Other | Attending: Emergency Medicine | Admitting: Emergency Medicine

## 2022-08-08 DIAGNOSIS — L02412 Cutaneous abscess of left axilla: Secondary | ICD-10-CM | POA: Diagnosis present

## 2022-08-08 MED ORDER — CEPHALEXIN 500 MG PO CAPS: 500.0000 mg | ORAL_CAPSULE | Freq: Four times a day (QID) | ORAL | 0 refills | Status: DC

## 2022-08-08 MED ORDER — OXYCODONE-ACETAMINOPHEN 5-325 MG PO TABS
1.0000 | ORAL_TABLET | Freq: Once | ORAL | Status: AC
  Administered 2022-08-08: 1 via ORAL
  Filled 2022-08-08: qty 1

## 2022-08-08 MED ORDER — LIDOCAINE-PRILOCAINE 2.5-2.5 % EX CREA
TOPICAL_CREAM | Freq: Once | CUTANEOUS | Status: AC
  Administered 2022-08-08: 1 via TOPICAL
  Filled 2022-08-08: qty 5

## 2022-08-08 MED ORDER — LIDOCAINE HCL (PF) 1 % IJ SOLN
10.0000 mL | Freq: Once | INTRAMUSCULAR | Status: AC
  Administered 2022-08-08: 5 mL
  Filled 2022-08-08: qty 10

## 2022-08-08 NOTE — ED Triage Notes (Signed)
Pt via POV c/o left axillary abscess x 3 weeks with history of same.

## 2022-08-08 NOTE — ED Provider Notes (Signed)
[facility] Provider Note   CSN: [id number] Arrival date & time: 08/08/22  1522     History  Chief Complaint  Patient presents with   Abscess    Tommy Fernandez is a 27 y.o. male who presents emergency department with concerns for left axillary abscess x 3 weeks.  Notes a history of similar symptoms at the beginning of January where he had the area I indeed while in [place].  Notes that he was not given anyone to follow-up with.  He was also not given any pain meds to follow-up with.  Denies fever or drainage at this time.  The history is provided by the patient. No language interpreter was used.       Home Medications Prior to Admission medications   Medication Sig Start Date End Date Taking? Authorizing Provider  cephALEXin (KEFLEX) 500 MG capsule Take 1 capsule (500 mg total) by mouth 4 (four) times daily. 08/08/22  Yes Adalynn Corne A, PA-C  Polyethyl Glycol-Propyl Glycol (SYSTANE) 0.4-0.3 % GEL ophthalmic gel Place 1 application into both eyes at bedtime as needed. 09/20/19   Ok Edwards, PA-C      Allergies    No known allergies    Review of Systems   Review of Systems  All other systems reviewed and are negative.   Physical Exam Updated Vital Signs BP 121/82 (BP Location: Right Arm)   Pulse 83   Temp 98.3 F (36.8 C) (Oral)   Resp 14   Ht 5\' 11"  (1.803 m)   Wt 59 kg   SpO2 99%   BMI 18.13 kg/m  Physical Exam Vitals and nursing note reviewed.  Constitutional:      General: He is not in acute distress.    Appearance: Normal appearance.  Eyes:     General: No scleral icterus.    Extraocular Movements: Extraocular movements intact.  Cardiovascular:     Rate and Rhythm: Normal rate.  Pulmonary:     Effort: Pulmonary effort is normal. No respiratory distress.  Abdominal:     Palpations: Abdomen is soft. There is no mass.     Tenderness: There is no abdominal tenderness.  Musculoskeletal:        General: Normal  range of motion.     Cervical back: Neck supple.  Skin:    General: Skin is warm and dry.     Findings: No rash.     Comments: 3 cm area of fluctuance noted to left axilla without surrounding erythema.  No appreciable induration noted to the area.  Tenderness to palpation noted to the area.  Neurological:     Mental Status: He is alert.     Sensory: Sensation is intact.     Motor: Motor function is intact.  Psychiatric:        Behavior: Behavior normal.     ED Results / Procedures / Treatments   Labs (all labs ordered are listed, but only abnormal results are displayed) Labs Reviewed - No data to display  EKG None  Radiology No results found.  Procedures .Marland KitchenIncision and Drainage  Date/Time: 08/08/2022 5:49 PM  Performed by: Nehemiah Settle, PA-C Authorized by: Nehemiah Settle, PA-C   Consent:    Consent obtained:  Verbal   Consent given by:  Patient   Risks discussed:  Bleeding, incomplete drainage and pain Universal protocol:    Patient identity confirmed:  Verbally with patient and hospital-assigned identification number Location:  Type:  Abscess   Size:  3   Location: left axilla. Pre-procedure details:    Skin preparation:  Povidone-iodine Sedation:    Sedation type:  None Anesthesia:    Anesthesia method:  Topical application and local infiltration   Topical anesthetic:  EMLA cream   Local anesthetic:  Lidocaine 1% w/o epi Procedure type:    Complexity:  Simple Procedure details:    Ultrasound guidance: no     Needle aspiration: no     Incision types:  Single straight   Incision depth:  Dermal   Wound management:  Probed and deloculated and irrigated with saline   Drainage:  Purulent   Drainage amount:  Moderate   Wound treatment:  Wound left open   Packing materials:  1/4 in iodoform gauze   Amount 1/4" iodoform:  2 inches Post-procedure details:    Procedure completion:  Tolerated well, no immediate complications     Medications Ordered in  ED Medications  lidocaine (PF) (XYLOCAINE) 1 % injection 10 mL (has no administration in time range)  lidocaine-prilocaine (EMLA) cream (1 Application Topical Given 08/08/22 1650)  oxyCODONE-acetaminophen (PERCOCET/ROXICET) 5-325 MG per tablet 1 tablet (1 tablet Oral Given 08/08/22 1718)    ED Course/ Medical Decision Making/ A&P                             Medical Decision Making Risk Prescription drug management.   Pt presents with abscess to the left axilla x 3 weeks.  History of similar symptoms.  Patient afebrile.  On exam patient with3 cm area of fluctuance noted to left axilla without surrounding erythema.  No appreciable induration noted to the area.  Tenderness to palpation noted to the area.  Differential diagnosis includes cellulitis, abscess, at bedtime.   Medications:  I ordered medication including Percocet for pain management Reevaluation of the patient after these medicines and interventions, I reevaluated the patient and found that they have improved I have reviewed the patients home medicines and have made adjustments as needed   Disposition: Presentation suspicious for abscess.  Doubt at this time concerns for cellulitis.. After consideration of the diagnostic results and the patients response to treatment, I feel that the patient would benefit from Discharge home.  Patient provided with information for symptom [facility] surgery to follow-up as needed.  Prescription for Keflex sent to patient's pharmacy.  Supportive care measures and strict return precautions discussed with patient at bedside. Pt acknowledges and verbalizes understanding. Pt appears safe for discharge. Follow up as indicated in discharge paperwork.    This chart was dictated using voice recognition software, Dragon. Despite the best efforts of this provider to proofread and correct errors, errors may still occur which can change documentation meaning.  Final Clinical Impression(s) / ED Diagnoses Final  diagnoses:  Abscess of left axilla    Rx / DC Orders ED Discharge Orders          Ordered    cephALEXin (KEFLEX) 500 MG capsule  4 times daily        08/08/22 1747              Wilfrido Luedke A, PA-C 08/08/22 1750    Fredia Sorrow, MD 08/11/22 0021

## 2022-08-08 NOTE — Discharge Instructions (Addendum)
It was a pleasure taking care of you today!   Your abscess was drained in the ED. It was also packed today.  It is important that you remove the packing within 48 hours.  You may continue with gentle massaging of the area as well as warm compresses.  You will be sent a prescription for Keflex, take as directed.  Attached is information for [facility] surgery, you may follow-up as needed.  At home for pain you may take over-the-counter 500 mg Tylenol every 6 hours and alternate with 600 mg ibuprofen every 6 hours as needed for pain for no more than 7 days.  Return to the emergency department if you are experiencing increasing/worsening symptoms.

## 2022-09-17 ENCOUNTER — Other Ambulatory Visit (HOSPITAL_COMMUNITY): Payer: Self-pay | Admitting: Gerontology

## 2022-09-17 ENCOUNTER — Ambulatory Visit (HOSPITAL_COMMUNITY)
Admission: RE | Admit: 2022-09-17 | Discharge: 2022-09-17 | Disposition: A | Discharge status: Home / Self Care | Payer: Medicaid Other | Source: Ambulatory Visit | Attending: Gerontology | Admitting: Gerontology

## 2022-09-17 DIAGNOSIS — M79642 Pain in left hand: Secondary | ICD-10-CM | POA: Insufficient documentation

## 2022-10-15 ENCOUNTER — Ambulatory Visit (INDEPENDENT_AMBULATORY_CARE_PROVIDER_SITE_OTHER): Payer: Medicaid Other | Admitting: Orthopedic Surgery

## 2022-10-15 ENCOUNTER — Encounter: Payer: Self-pay | Admitting: Orthopedic Surgery

## 2022-10-15 VITALS — BP 132/78 | HR 77 | Ht 72.0 in | Wt 153.0 lb

## 2022-10-15 DIAGNOSIS — M79642 Pain in left hand: Secondary | ICD-10-CM

## 2022-10-15 DIAGNOSIS — M549 Dorsalgia, unspecified: Secondary | ICD-10-CM | POA: Diagnosis not present

## 2022-10-15 MED ORDER — DICLOFENAC SODIUM 50 MG PO TBEC: 50.0000 mg | DELAYED_RELEASE_TABLET | Freq: Two times a day (BID) | ORAL | 0 refills | Status: DC

## 2022-10-15 NOTE — Progress Notes (Signed)
New Patient Visit  Assessment: Tommy Fernandez is a 27 y.o. male with the following: 1. Upper back pain 2. Pain in left hand  Plan: DAOUDA CRESSLER has progressively worsening pain in his left hand.  Pain is primarily ulnar.  No specific injury.  He has some nerve related symptoms.  Negative Tinel's at the cubital tunnel.  Negative Tinel's at the carpal tunnel.  Unclear what is causing his pain.  In addition, he reports some upper back pain, that gets worse throughout the day.  He has not done any physical therapy.  No prior treatment for either condition.  As such, I am recommending physical therapy, as well as a prescription for meloxicam.  Follow-up: Return if symptoms worsen or fail to improve.  Subjective:  Chief Complaint  Patient presents with   Hand Pain    Lt hand and wrist pain for 2 mos becoming more noticeable     History of Present Illness: Tommy Fernandez is a 27 y.o. male who has been referred by  Avon Gully, MD for evaluation of left hand pain.  He states has had pain in the ulnar side of his left hand for the past 2 months.  No specific injury.  Symptoms are getting worse.  He also notes some radiating pains into the fingers on the ulnar hand, as well as some associated numbness and tingling.  He has tried some over-the-counter pain medications with limited improvement in his symptoms.  He also reports some upper back pain, that gets progressively worse.  If he sits for too long, he notes that the pain continues to worsen.   Review of Systems: No fevers or chills No numbness or tingling No chest pain No shortness of breath No bowel or bladder dysfunction No GI distress No headaches   Medical History:  Past Medical History:  Diagnosis Date   Anxiety    Asthma    as a child    Bipolar and related disorder    Bradycardia 2015   Constipation    Depression    Dyspnea    sometimes just standing up no exertion   Family history of adverse reaction to  anesthesia    takes more medication to get her to sleep   GERD (gastroesophageal reflux disease)    Syncope 2015   10/21/16- last time approx 1 year ago    Past Surgical History:  Procedure Laterality Date   CLOSED REDUCTION FINGER WITH PERCUTANEOUS PINNING Right 10/22/2016   Procedure: RIGHT RING FINGER CLOSED REDUCTION AND PINNING, POSSIBLE OPEN REDUCTION EXTERNAL FIXATION (ORIF) FINGER;  Surgeon: Bradly Bienenstock, MD;  Location: [facility] OR;  Service: Orthopedics;  Laterality: Right;    No family history on file. Social History   Tobacco Use   Smoking status: Former    Packs/day: 0.50    Years: 5.00    Additional pack years: 0.00    Total pack years: 2.50    Types: Cigarettes   Smokeless tobacco: Never   Tobacco comments:    Quit January 2018   Substance Use Topics   Alcohol use: Yes    Comment: 1/2 fifth a week   Drug use: Not Currently    Comment: LSD- none since 02/2015    Allergies  Allergen Reactions   No Known Allergies     Current Meds  Medication Sig   diclofenac (VOLTAREN) 50 MG EC tablet Take 1 tablet (50 mg total) by mouth 2 (two) times daily.   traZODone (DESYREL) 50 MG  tablet Take 50 mg by mouth at bedtime.    Objective: BP 132/78   Pulse 77   Ht 6' (1.829 m)   Wt 153 lb (69.4 kg)   BMI 20.75 kg/m   Physical Exam:  General: Alert and oriented. and No acute distress. Gait: Normal gait.  Left hand without deformity.  No swelling.  No bruising.  Mild tenderness to palpation within the ulnar wrist.  Mild discomfort with compression of the TFCC.  Able to make a full fist.  Sensation intact throughout the left hand.  Negative Tinel's at the elbow.  Negative Tinel's at the carpal tunnel.  No deformity in his upper back.  IMAGING: I personally reviewed images previously obtained in clinic  X-rays were previously obtained.  These are negative for any injury.   New Medications:  Meds ordered this encounter  Medications   diclofenac (VOLTAREN) 50 MG EC  tablet    Sig: Take 1 tablet (50 mg total) by mouth 2 (two) times daily.    Dispense:  60 tablet    Refill:  0      Oliver Barre, MD  10/15/2022 11:40 AM

## 2022-10-16 ENCOUNTER — Telehealth: Payer: Self-pay | Admitting: Orthopedic Surgery

## 2022-10-16 ENCOUNTER — Encounter: Payer: Self-pay | Admitting: Orthopedic Surgery

## 2022-10-16 MED ORDER — MELOXICAM 7.5 MG PO TABS: 7.5000 mg | ORAL_TABLET | Freq: Every day | ORAL | 0 refills | Status: DC

## 2022-10-16 NOTE — Telephone Encounter (Signed)
Diclofenac is not preferred Drug on Medicaid Meloxicam Sulindac Ibuprofen And  Naproxen Are preferred with Medicaid  Will  you please change so Medicaid will cover?

## 2022-10-16 NOTE — Telephone Encounter (Signed)
Dr. Dallas Schimke pt - pt lvm during lunch hours stating that he was seen yesterday and that the medication that was sent in is requiring a prior authorization.  Pt's # [phone]

## 2022-12-20 ENCOUNTER — Other Ambulatory Visit: Payer: Self-pay | Admitting: Orthopedic Surgery
